# Patient Record
Sex: Male | Born: 1970
Health system: Southern US, Community
[De-identification: ages and names within clinical notes are randomized; demographics above are authoritative.]

## PROBLEM LIST (undated history)

## (undated) DIAGNOSIS — E785 Hyperlipidemia, unspecified: Secondary | ICD-10-CM

## (undated) DIAGNOSIS — E119 Type 2 diabetes mellitus without complications: Secondary | ICD-10-CM

## (undated) HISTORY — DX: Type 2 diabetes mellitus without complications: E11.9

## (undated) HISTORY — DX: Hyperlipidemia, unspecified: E78.5

---

## 1993-08-02 HISTORY — PX: HERNIA REPAIR: SHX51

## 2002-08-02 HISTORY — PX: TONSILLECTOMY: SUR1361

## 2015-03-10 ENCOUNTER — Telehealth: Payer: Self-pay | Admitting: General Practice

## 2015-03-10 NOTE — Telephone Encounter (Signed)
yes

## 2015-03-10 NOTE — Telephone Encounter (Signed)
Pt needing a pcp and Ian Woodard had recommended you to him. Please advise

## 2015-03-10 NOTE — Telephone Encounter (Signed)
LMOVM

## 2015-03-18 ENCOUNTER — Ambulatory Visit (INDEPENDENT_AMBULATORY_CARE_PROVIDER_SITE_OTHER)
Admission: RE | Admit: 2015-03-18 | Discharge: 2015-03-18 | Disposition: A | Discharge status: Home / Self Care | Payer: BLUE CROSS/BLUE SHIELD | Source: Ambulatory Visit | Attending: Internal Medicine | Admitting: Internal Medicine

## 2015-03-18 ENCOUNTER — Encounter: Payer: Self-pay | Admitting: Internal Medicine

## 2015-03-18 ENCOUNTER — Ambulatory Visit (INDEPENDENT_AMBULATORY_CARE_PROVIDER_SITE_OTHER): Payer: BLUE CROSS/BLUE SHIELD | Admitting: Internal Medicine

## 2015-03-18 VITALS — BP 108/80 | HR 80 | Temp 98.2°F | Resp 16 | Ht 71.0 in | Wt 194.0 lb

## 2015-03-18 DIAGNOSIS — R1314 Dysphagia, pharyngoesophageal phase: Secondary | ICD-10-CM | POA: Diagnosis not present

## 2015-03-18 DIAGNOSIS — R05 Cough: Secondary | ICD-10-CM

## 2015-03-18 DIAGNOSIS — N528 Other male erectile dysfunction: Secondary | ICD-10-CM

## 2015-03-18 DIAGNOSIS — J189 Pneumonia, unspecified organism: Secondary | ICD-10-CM

## 2015-03-18 DIAGNOSIS — N529 Male erectile dysfunction, unspecified: Secondary | ICD-10-CM

## 2015-03-18 DIAGNOSIS — R059 Cough, unspecified: Secondary | ICD-10-CM

## 2015-03-18 MED ORDER — AMOXICILLIN-POT CLAVULANATE 875-125 MG PO TABS
1.0000 | ORAL_TABLET | Freq: Two times a day (BID) | ORAL | Status: DC
Start: 2015-03-18 — End: 2015-04-04

## 2015-03-18 MED ORDER — HYDROCODONE-HOMATROPINE 5-1.5 MG/5ML PO SYRP: 5.0000 mL | ORAL_SOLUTION | Freq: Three times a day (TID) | ORAL | Status: DC | PRN

## 2015-03-18 MED ORDER — SILDENAFIL CITRATE 100 MG PO TABS: 100.0000 mg | ORAL_TABLET | Freq: Every day | ORAL | Status: DC | PRN

## 2015-03-18 NOTE — Progress Notes (Signed)
Pre visit review using our clinic review tool, if applicable. No additional management support is needed unless otherwise documented below in the visit note. 

## 2015-03-18 NOTE — Patient Instructions (Signed)
Cough, Adult  A cough is a reflex that helps clear your throat and airways. It can help heal the body or may be a reaction to an irritated airway. A cough may only last 2 or 3 weeks (acute) or may last more than 8 weeks (chronic).  CAUSES Acute cough:  Viral or bacterial infections. Chronic cough:  Infections.  Allergies.  Asthma.  Post-nasal drip.  Smoking.  Heartburn or acid reflux.  Some medicines.  Chronic lung problems (COPD).  Cancer. SYMPTOMS   Cough.  Fever.  Chest pain.  Increased breathing rate.  High-pitched whistling sound when breathing (wheezing).  Colored mucus that you cough up (sputum). TREATMENT   A bacterial cough may be treated with antibiotic medicine.  A viral cough must run its course and will not respond to antibiotics.  Your caregiver may recommend other treatments if you have a chronic cough. HOME CARE INSTRUCTIONS   Only take over-the-counter or prescription medicines for pain, discomfort, or fever as directed by your caregiver. Use cough suppressants only as directed by your caregiver.  Use a cold steam vaporizer or humidifier in your bedroom or home to help loosen secretions.  Sleep in a semi-upright position if your cough is worse at night.  Rest as needed.  Stop smoking if you smoke. SEEK IMMEDIATE MEDICAL CARE IF:   You have pus in your sputum.  Your cough starts to worsen.  You cannot control your cough with suppressants and are losing sleep.  You begin coughing up blood.  You have difficulty breathing.  You develop pain which is getting worse or is uncontrolled with medicine.  You have a fever. MAKE SURE YOU:   Understand these instructions.  Will watch your condition.  Will get help right away if you are not doing well or get worse. Document Released: 01/15/2011 Document Revised: 10/11/2011 Document Reviewed: 01/15/2011 ExitCare Patient Information 2015 [employer]. This information is not intended  to replace advice given to you by your health care provider. Make sure you discuss any questions you have with your health care provider.  

## 2015-03-19 ENCOUNTER — Encounter: Payer: Self-pay | Admitting: Physician Assistant

## 2015-03-19 LAB — HIV ANTIBODY: HIV 1 Ab: NEGATIVE

## 2015-03-19 NOTE — Progress Notes (Signed)
Subjective:  Patient ID: Ian Woodard, male    DOB: [birth year]  Age: 44 y.o. MRN: [id number]  CC: Cough   [name] presents for one-month history of cough productive of thick brown phlegm. He also complains of mild shortness of breath but no wheezing. He also complains of difficulty swallowing and tells me that many years ago he had surgical repair of a hiatal hernia. He thinks he has a history of gastroesophageal reflux disease but he does not complain of heartburn and does not want a medication for heartburn. In addition he complains of a clogged sensation in his right ear.  History Ian Woodard has no past medical history on file.   He has past surgical history that includes Hernia repair (1995) and Tonsillectomy (2004).   His family history includes Hypertension in his father and mother. There is no history of Alcohol abuse, Cancer, COPD, Diabetes, Early death, Drug abuse, Hearing loss, Heart disease, Hyperlipidemia, Kidney disease, or Stroke.He reports that he has never smoked. He has never used smokeless tobacco. He reports that he drinks about 0.6 oz of alcohol per week. He reports that he does not use illicit drugs.  No outpatient prescriptions prior to visit.   No facility-administered medications prior to visit.    ROS Review of Systems  Constitutional: Negative.  Negative for fever, chills, diaphoresis, appetite change and fatigue.  HENT: Positive for hearing loss and trouble swallowing. Negative for congestion, dental problem, ear discharge, ear pain, postnasal drip, rhinorrhea, sinus pressure, sneezing, tinnitus and voice change.   Eyes: Negative.   Respiratory: Positive for cough and shortness of breath. Negative for choking, chest tightness and stridor.   Cardiovascular: Negative.   Gastrointestinal: Negative.  Negative for nausea, vomiting, abdominal pain, diarrhea, constipation and blood in stool.  Endocrine: Negative.   Genitourinary: Negative.  Negative for  urgency, hematuria and difficulty urinating.       He complains of erectile dysfunction for over 2 years. He says he can achieve an erection but then gets anxious and loses his erection. He is requesting a medication to help with that.  Musculoskeletal: Negative.   Skin: Negative.   Allergic/Immunologic: Negative.   Neurological: Negative.   Hematological: Negative.  Negative for adenopathy. Does not bruise/bleed easily.  Psychiatric/Behavioral: Negative.     Objective:  BP 108/80 mmHg  Pulse 109  Temp(Src) 98.2 F (36.8 C) (Oral)  Resp 16  Ht 5\' 11"  (1.803 m)  Wt 194 lb (87.998 kg)  BMI 27.07 kg/m2  SpO2 98%  Physical Exam  Constitutional: He is oriented to person, place, and time. He appears well-developed and well-nourished.  Non-toxic appearance. He does not have a sickly appearance. He does not appear ill. No distress.  HENT:  Right Ear: Hearing, tympanic membrane and external ear normal. A foreign body (cerumen) is present.  Left Ear: Hearing, tympanic membrane, external ear and ear canal normal.  Mouth/Throat: Mucous membranes are normal. Mucous membranes are not pale, not dry and not cyanotic. No oropharyngeal exudate, posterior oropharyngeal edema, posterior oropharyngeal erythema or tonsillar abscesses.  I put Colace in his right ear and then we irrigated it with water. All of the cerumen was removed. He tolerated this procedure well. Examination afterwards is normal.  Eyes: Conjunctivae are normal. Right eye exhibits no discharge. Left eye exhibits no discharge. No scleral icterus.  Neck: Normal range of motion. Neck supple. No JVD present. No tracheal deviation present. No thyromegaly present.  Cardiovascular: Normal rate, regular rhythm, normal heart  sounds and intact distal pulses.  Exam reveals no gallop and no friction rub.   No murmur heard. Pulmonary/Chest: Effort normal and breath sounds normal. No stridor. No respiratory distress. He has no wheezes. He has no  rales. He exhibits no tenderness.  Abdominal: Soft. Bowel sounds are normal. He exhibits no distension and no mass. There is no tenderness. There is no rebound and no guarding.  Musculoskeletal: Normal range of motion. He exhibits no edema or tenderness.  Lymphadenopathy:    He has no cervical adenopathy.  Neurological: He is oriented to person, place, and time.  Skin: Skin is warm and dry. No rash noted. He is not diaphoretic. No erythema. No pallor.      Assessment & Plan:   Ian Woodard was seen today for cough.  Diagnoses and all orders for this visit:  Cough- his chest x-ray is remarkable only for low volumes and atelectasis but there is no mass or infiltrate -     DG Chest 2 View; Future -     HYDROcodone-homatropine (HYCODAN) 5-1.5 MG/5ML syrup; Take 5 mLs by mouth every 8 (eight) hours as needed for cough.  CAP (community acquired pneumonia)- will treat his infection with Augmentin and he will use Hycodan as needed for the cough -     DG Chest 2 View; Future -     amoxicillin-clavulanate (AUGMENTIN) 875-125 MG per tablet; Take 1 tablet by mouth 2 (two) times daily. -     HYDROcodone-homatropine (HYCODAN) 5-1.5 MG/5ML syrup; Take 5 mLs by mouth every 8 (eight) hours as needed for cough.  Dysphagia, pharyngoesophageal phase -     Ambulatory referral to Gastroenterology  Erectile dysfunction of organic origin -     sildenafil (VIAGRA) 100 MG tablet; Take 1 tablet (100 mg total) by mouth daily as needed for erectile dysfunction.   I am having Ian Woodard start on amoxicillin-clavulanate, HYDROcodone-homatropine, and sildenafil.  Meds ordered this encounter  Medications  . amoxicillin-clavulanate (AUGMENTIN) 875-125 MG per tablet    Sig: Take 1 tablet by mouth 2 (two) times daily.    Dispense:  20 tablet    Refill:  0  . HYDROcodone-homatropine (HYCODAN) 5-1.5 MG/5ML syrup    Sig: Take 5 mLs by mouth every 8 (eight) hours as needed for cough.    Dispense:  120 mL    Refill:   0  . sildenafil (VIAGRA) 100 MG tablet    Sig: Take 1 tablet (100 mg total) by mouth daily as needed for erectile dysfunction.    Dispense:  10 tablet    Refill:  11     Follow-up: Return in about 3 weeks (around 04/08/2015).  Sanda Linger, MD

## 2015-04-04 ENCOUNTER — Encounter: Payer: Self-pay | Admitting: Physician Assistant

## 2015-04-04 ENCOUNTER — Ambulatory Visit (INDEPENDENT_AMBULATORY_CARE_PROVIDER_SITE_OTHER): Payer: BLUE CROSS/BLUE SHIELD | Admitting: Physician Assistant

## 2015-04-04 VITALS — BP 122/68 | HR 70 | Ht 71.0 in | Wt 195.0 lb

## 2015-04-04 DIAGNOSIS — K219 Gastro-esophageal reflux disease without esophagitis: Secondary | ICD-10-CM | POA: Diagnosis not present

## 2015-04-04 DIAGNOSIS — IMO0001 Reserved for inherently not codable concepts without codable children: Secondary | ICD-10-CM

## 2015-04-04 DIAGNOSIS — R1310 Dysphagia, unspecified: Secondary | ICD-10-CM

## 2015-04-04 DIAGNOSIS — R143 Flatulence: Secondary | ICD-10-CM

## 2015-04-04 MED ORDER — PANTOPRAZOLE SODIUM 40 MG PO TBEC: DELAYED_RELEASE_TABLET | ORAL | Status: DC

## 2015-04-04 NOTE — Patient Instructions (Addendum)
Take Beano with meals. We sent a prescription to [facility] [address] for Pantoprazole sodium 40 mg. You have been scheduled for an endoscopy. Please follow written instructions given to you at your visit today. If you use inhalers (even only as needed), please bring them with you on the day of your procedure.  You have been scheduled for a Barium Esophogram at [facility], go to the [employer]. on  04-14-2015 at 10:00 am . Please arrive at 9:45 am to your appointment for registration. Make certain not to have anything to eat or drink 3 hours prior to your test. If you need to reschedule for any reason, please contact radiology at [phone] to do so. __________________________________________________________________ A barium swallow is an examination that concentrates on views of the esophagus. This tends to be a double contrast exam (barium and two liquids which, when combined, create a gas to distend the wall of the oesophagus) or single contrast (non-ionic iodine based). The study is usually tailored to your symptoms so a good history is essential. Attention is paid during the study to the form, structure and configuration of the esophagus, looking for functional disorders (such as aspiration, dysphagia, achalasia, motility and reflux) EXAMINATION You may be asked to change into a gown, depending on the type of swallow being performed. A radiologist and radiographer will perform the procedure. The radiologist will advise you of the type of contrast selected for your procedure and direct you during the exam. You will be asked to stand, sit or lie in several different positions and to hold a small amount of fluid in your mouth before being asked to swallow while the imaging is performed .In some instances you may be asked to swallow barium coated marshmallows to assess the motility of a solid food bolus. The exam can be recorded as a digital or video fluoroscopy procedure. POST  PROCEDURE It will take 1-2 days for the barium to pass through your system. To facilitate this, it is important, unless otherwise directed, to increase your fluids for the next 24-48hrs and to resume your normal diet.  This test typically takes about 30 minutes to perform. __________________________________________________________________________________

## 2015-04-04 NOTE — Progress Notes (Addendum)
Patient ID: Ian Woodard, male   DOB: 1971/07/28, 44 y.o.   MRN: [id number]    HPI:  Ian Woodard is a 44 y.o.   male referred by [name], MD for evaluation of dysphagia.  Rory states that he was evaluated several times in the 1990s by Dr. Corinda Gubler. At that time, he was experiencing dysphagia to solids and liquids. He states he had 3 upper endoscopies with dilations, and each time had relief of his dysphagia for a period of time. He subsequently had surgery for a hiatal hernia and what he describes as a "pocket at the top of my esophagus". (?Zenkers?) He has done well since that time but he states that in July 8 developed a nonproductive cough. He was evaluated by his primary care provider and sent for a chest x-ray which he states was negative. He was treated with Hycodan syrup which he says has provided little relief. He was also given a 7 day course of Augmentin for  Possible community-acquired pneumonia. He feels this did not help his symptoms. He is here today with complaints of a several month history of intermittent heartburn. He is again experiencing difficulty swallowing solids. 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 Over the past 1-2 months he has been experiencing intermittent dysphagia to liquids as well. He notes that if he drinks something really cold he will feel like he has a lump in his chest. Further questioning reveals that he was scheduled for a 24-hour pH test in the 1990s but was unable to tolerate placement of the pH probe due to his gag reflex.  His bowel movements are for the most part normal but for the past couple of weeks he has skipped 2 days and then has 2 bowel movements in the morning. He has no bright red blood per rectum or melena. His appetite has been good. He is purposely trying to lose weight. He complains of gas when he eats raw vegetables. He denies a family history of colon cancer, colon polyps, or inflammatory bowel disease. He does  state that his maternal grandfather had colon issues, but he thinks this was irritable bowel.   Past Medical History  Diagnosis Date  . Diabetes mellitus     Past Surgical History  Procedure Laterality Date  . Hernia repair  1995  . Tonsillectomy  2004   Family History  Problem Relation Age of Onset  . Hypertension Mother   . Hypertension Father   . Alcohol abuse Neg Hx   . Cancer Neg Hx   . COPD Neg Hx   . Diabetes Neg Hx   . Early death Neg Hx   . Drug abuse Neg Hx   . Hearing loss Neg Hx   . Heart disease Neg Hx   . Hyperlipidemia Neg Hx   . Kidney disease Neg Hx   . Stroke Neg Hx    Social History  Substance Use Topics  . Smoking status: Never Smoker   . Smokeless tobacco: Never Used  . Alcohol Use: 0.6 oz/week    0 Standard drinks or equivalent, 1 Glasses of wine per week   Current Outpatient Prescriptions  Medication Sig Dispense Refill  . HYDROcodone-homatropine (HYCODAN) 5-1.5 MG/5ML syrup Take 5 mLs by mouth every 8 (eight) hours as needed for cough. 120 mL 0  . pantoprazole (PROTONIX) 40 MG tablet Take 1 tab 30 min prior to breakfast. 30 tablet 6   No current facility-administered medications for  this visit.   No Known Allergies   Review of Systems: Gen: Denies any fever, chills, sweats, anorexia, fatigue, weakness, malaise, weight loss, and sleep disorder CV: Denies chest pain, angina, palpitations, syncope, orthopnea, PND, peripheral edema, and claudication. Resp: Denies dyspnea at rest, dyspnea with exercise,  sputum, wheezing, coughing up blood, and pleurisy. Has a nonproductive cough. GI: Denies vomiting blood, jaundice, and fecal incontinence.  Has dysphagia to solids and liquids. GU : Denies urinary burning, blood in urine, urinary frequency, urinary hesitancy, nocturnal urination, and urinary incontinence. MS: Denies joint pain, limitation of movement, and swelling, stiffness, low back pain, extremity pain. Denies muscle weakness, cramps, atrophy.   Derm: Denies rash, itching, dry skin, hives, moles, warts, or unhealing ulcers.  Psych: Denies depression, anxiety, memory loss, suicidal ideation, hallucinations, paranoia, and confusion. Heme: Denies bruising, bleeding, and enlarged lymph nodes. Neuro:  Denies any headaches, dizziness, paresthesias. Endo:  Denies any problems with DM, thyroid, adrenal function  Studies: Dg Chest 2 View  03/18/2015   CLINICAL DATA:  Cough.  EXAM: CHEST  2 VIEW  COMPARISON:  None.  FINDINGS: Mediastinum and hilar structures normal. Low lung volumes with mild bibasilar atelectasis. No focal infiltrate. No pleural effusion or pneumothorax. No acute bony abnormality.  IMPRESSION: Low lung volumes with mild bibasilar subsegmental atelectasis.   Electronically Signed   By: Maisie Fus  Register   On: 03/18/2015 17:04     Physical Exam: BP 122/68 mmHg  Pulse 70  Ht 5\' 11"  (1.803 m)  Wt 195 lb (88.451 kg)  BMI 27.21 kg/m2 Constitutional: Pleasant,well-developed, male in no acute distress. HEENT: Normocephalic and atraumatic. Conjunctivae are normal. No scleral icterus. Neck supple. No JVD. No thyromegaly Cardiovascular: Normal rate, regular rhythm.  Pulmonary/chest: Effort normal and breath sounds normal. No wheezing, rales or rhonchi. Abdominal: Soft, nondistended, nontender. Bowel sounds active throughout. There are no masses palpable. No hepatomegaly. Extremities: no edema Lymphadenopathy: No cervical adenopathy noted. Neurological: Alert and oriented to person place and time. Skin: Skin is warm and dry. No rashes noted. Psychiatric: Normal mood and affect. Behavior is normal.  ASSESSMENT AND PLAN: 74 -year-old male with a long-standing history of GERD, status post EGD with dilation 3 in the 1990s followed by subsequent hiatal hernia repair, now with recurrent dysphagia, intermittent heartburn, and a nonproductive cough. We have contacted medical records to obtain copies of his prior endoscopy reports as  well as his operative report. Unfortunately these are on hold paper charts and will have to be retrieved from the warehouse. He file will be mailed to our office when it is retrieved. In the meantime, an antireflux regimen has been reviewed. He will be given a trial of pantoprazole 40 mg 1 by mouth every morning 30 minutes prior to breakfast. He will be scheduled for a barium swallow with tablet to help ascertain if he perhaps has a recurrent stricture, hiatal hernia etc. He will then be scheduled for an EGD to evaluate for esophagitis, recurrent stricture, etc.The risks, benefits, and alternatives to endoscopy with possible biopsy and possible dilation were discussed with the patient and they consent to proceed. In the meantime regards to his flatulence with ingestion of that staple's, he will try Beano with meals and snacks. He will try to add fiber to his diet as well to help regulate his stools. Further recommendations will be made pending the findings of his esophagram and EGD.    Briani Maul, Tollie Pizza PA-C 04/04/2015, 9:22 PM  CC: [name], MD  04/18/2015: Addendum:  Received old paper records from storage. Patient had an upper endoscopy on Dec 18, 1993 that revealed a 3 cm sliding hiatal hernia with stricture formation and mild reflux esophagitis. The stricture caused 75-80% obstruction. The stomach revealed a mild diffuse gastritis. Pyloric channel had slight inflammation. Duodenal bulb had mild duodenitis. Patient was started on Prilosec. (A second paper chart was reviewed that showed the patient underwent a laparoscopic Nissen fundoplication in early December 1995 by Dr. Chelsea Aus of [facility]).  EGD 06/24/1991 was performed due to dysphagia. Esophagus was noted to have a 3-4 cm sliding hiatal hernia with mildly acute and chronic reflux esophagitis with early stricture formation. Stomach had mild patchy gastritis. Pyloric channel had slight inflammation. Duodenal bulb  had mild to moderate duodenitis with absolute was ulcers and biopsies were obtained in the post bulbar region. Pathology revealed sections of duodenal mucosa with active chronic duodenitis. No granulomas are seen. Focal ulceration and surface fibrinopurulent debris are present.  EGD 10/27/1989 was performed due to vomiting and reflux symptoms. Patient was noted to have a 2-3 cm sliding hiatal hernia with a somewhat spastic esophagus which was dilated with 44 and 50 mm dilators. Stomach had mild patchy gastritis. Pyloric channel slightly inflamed. Duodenal bulb mild duodenitis.

## 2015-04-08 NOTE — Progress Notes (Signed)
Agree with assessment and plan as outlined. Will review prior records in the interim when obtained.

## 2015-04-10 ENCOUNTER — Ambulatory Visit (HOSPITAL_COMMUNITY): Payer: BLUE CROSS/BLUE SHIELD

## 2015-04-14 ENCOUNTER — Ambulatory Visit (HOSPITAL_COMMUNITY)
Admission: RE | Admit: 2015-04-14 | Discharge: 2015-04-14 | Disposition: A | Discharge status: Home / Self Care | Payer: BLUE CROSS/BLUE SHIELD | Source: Ambulatory Visit | Attending: Physician Assistant | Admitting: Physician Assistant

## 2015-04-14 DIAGNOSIS — R143 Flatulence: Secondary | ICD-10-CM | POA: Diagnosis not present

## 2015-04-14 DIAGNOSIS — K219 Gastro-esophageal reflux disease without esophagitis: Secondary | ICD-10-CM | POA: Diagnosis not present

## 2015-04-14 DIAGNOSIS — R1310 Dysphagia, unspecified: Secondary | ICD-10-CM | POA: Diagnosis not present

## 2015-04-14 DIAGNOSIS — IMO0001 Reserved for inherently not codable concepts without codable children: Secondary | ICD-10-CM

## 2015-04-14 MED ORDER — MAGNESIUM HYDROXIDE 400 MG/5ML PO SUSP
ORAL | Status: AC
  Filled 2015-04-14: qty 30

## 2015-05-16 ENCOUNTER — Encounter: Payer: Self-pay | Admitting: Gastroenterology

## 2015-05-16 ENCOUNTER — Ambulatory Visit (AMBULATORY_SURGERY_CENTER): Payer: BLUE CROSS/BLUE SHIELD | Admitting: Gastroenterology

## 2015-05-16 VITALS — BP 101/70 | HR 62 | Temp 96.6°F | Resp 24 | Ht 71.0 in | Wt 195.0 lb

## 2015-05-16 DIAGNOSIS — R1310 Dysphagia, unspecified: Secondary | ICD-10-CM

## 2015-05-16 MED ORDER — SODIUM CHLORIDE 0.9 % IV SOLN: 500.0000 mL | INTRAVENOUS | Status: DC

## 2015-05-16 NOTE — Progress Notes (Signed)
Transferred to recovery room. A/O x3, pleased with MAC.  VSS.  Report to Celia, RN. 

## 2015-05-16 NOTE — Op Note (Signed)
[facility] [address] [place], [id number]   ENDOSCOPY PROCEDURE REPORT  PATIENT: Ian Woodard, Ian Woodard  MR#: [id number] BIRTHDATE: [birth year] , 44  yrs. old GENDER: male ENDOSCOPIST: Benancio DeedsSteven P Ziaire Bieser, MD REFERRED BY: PROCEDURE DATE:  05/16/2015 PROCEDURE:  EGD w/ biopsy ASA CLASS:     Class II INDICATIONS:  dysphagia. MEDICATIONS: Propofol 200 mg IV TOPICAL ANESTHETIC:  DESCRIPTION OF PROCEDURE: After the risks benefits and alternatives of the procedure were thoroughly explained, informed consent was obtained.  The LB [id number] endoscope was introduced through the mouth and advanced to the second portion of the duodenum , Without limitations.  The instrument was slowly withdrawn as the mucosa was fully examined.  FINDINGS:The examined esophagus was normal.  No stenosis or stricture was appreciated.  Biopsies were taken to rule out eosinophilic esophagitis.  The DH, GEJ, and SCJ were located 40cm from the incisors.  There was evidence of a Nissen fundoplication noted on retroflexion in the stomach, which was intact without laxity.  The stomach was remarkable for diffuse erythematous gastropathy without focal ulceration or erosion.  Biopsies were taken to rule out H pylori.  The dudoenal bulb and 2nd portion of the duodenum were unremarkable.  Retroflexed views revealed as previously described.     The scope was then withdrawn from the patient and the procedure completed. COMPLICATIONS: There were no immediate complications.  ENDOSCOPIC IMPRESSION: Normal esophagus without stenosis or stricture - biopsies obtained to rule out EoE Evidence of prior Nissen fundoplication, intact without laxity Erythematous gastropathy - biopsied to rule out H pylori Normal duodenum  No overt evidence of pathology noted on this exam to cause dysphagia other than known Nissen. Await biopsy results and consider esophageal manometry if symptoms  persist.  RECOMMENDATIONS: Resume diet Resume medications Avoid NSAIDs given gastritis noted on this exam Continue protonix Await pathology results    eSigned:  Benancio DeedsSteven P Craig Wisnewski, MD 05/16/2015 10:21 AM    CC: the patient  PATIENT NAME:  Ian Woodard, Ian Woodard MR#: [id number]

## 2015-05-16 NOTE — Progress Notes (Signed)
Called to room to assist during endoscopic procedure.  Patient ID and intended procedure confirmed with present staff. Received instructions for my participation in the procedure from the performing physician.  

## 2015-05-16 NOTE — Patient Instructions (Signed)
Discharge instructions given. Biopsies taken. Avoid all NSAIDS. Resume previous medications. YOU HAD AN ENDOSCOPIC PROCEDURE TODAY AT THE [facility]:   Refer to the procedure report that was given to you for any specific questions about what was found during the examination.  If the procedure report does not answer your questions, please call your gastroenterologist to clarify.  If you requested that your care partner not be given the details of your procedure findings, then the procedure report has been included in a sealed envelope for you to review at your convenience later.  YOU SHOULD EXPECT: Some feelings of bloating in the abdomen. Passage of more gas than usual.  Walking can help get rid of the air that was put into your GI tract during the procedure and reduce the bloating. If you had a lower endoscopy (such as a colonoscopy or flexible sigmoidoscopy) you may notice spotting of blood in your stool or on the toilet paper. If you underwent a bowel prep for your procedure, you may not have a normal bowel movement for a few days.  Please Note:  You might notice some irritation and congestion in your nose or some drainage.  This is from the oxygen used during your procedure.  There is no need for concern and it should clear up in a day or so.  SYMPTOMS TO REPORT IMMEDIATELY:   Following upper endoscopy (EGD)  Vomiting of blood or coffee ground material  New chest pain or pain under the shoulder blades  Painful or persistently difficult swallowing  New shortness of breath  Fever of 100F or higher  Black, tarry-looking stools  For urgent or emergent issues, a gastroenterologist can be reached at any hour by calling [phone].   DIET: Your first meal following the procedure should be a small meal and then it is ok to progress to your normal diet. Heavy or fried foods are harder to digest and may make you feel nauseous or bloated.  Likewise, meals heavy in dairy and  vegetables can increase bloating.  Drink plenty of fluids but you should avoid alcoholic beverages for 24 hours.  ACTIVITY:  You should plan to take it easy for the rest of today and you should NOT DRIVE or use heavy machinery until tomorrow (because of the sedation medicines used during the test).    FOLLOW UP: Our staff will call the number listed on your records the next business day following your procedure to check on you and address any questions or concerns that you may have regarding the information given to you following your procedure. If we do not reach you, we will leave a message.  However, if you are feeling well and you are not experiencing any problems, there is no need to return our call.  We will assume that you have returned to your regular daily activities without incident.  If any biopsies were taken you will be contacted by phone or by letter within the next 1-3 weeks.  Please call us at [phone] if you have not heard about the biopsies in 3 weeks.    SIGNATURES/CONFIDENTIALITY: You and/or your care partner have signed paperwork which will be entered into your electronic medical record.  These signatures attest to the fact that that the information above on your After Visit Summary has been reviewed and is understood.  Full responsibility of the confidentiality of this discharge information lies with you and/or your care-partner. 

## 2015-05-19 ENCOUNTER — Telehealth: Payer: Self-pay | Admitting: *Deleted

## 2015-05-19 NOTE — Telephone Encounter (Signed)
No answer, message left for the patient. 

## 2015-05-22 ENCOUNTER — Other Ambulatory Visit: Payer: Self-pay

## 2015-05-22 DIAGNOSIS — IMO0001 Reserved for inherently not codable concepts without codable children: Secondary | ICD-10-CM

## 2015-05-22 DIAGNOSIS — A048 Other specified bacterial intestinal infections: Secondary | ICD-10-CM

## 2015-05-22 DIAGNOSIS — R1310 Dysphagia, unspecified: Secondary | ICD-10-CM

## 2015-05-22 DIAGNOSIS — K219 Gastro-esophageal reflux disease without esophagitis: Secondary | ICD-10-CM

## 2015-05-22 MED ORDER — METRONIDAZOLE 500 MG PO TABS: 500.0000 mg | ORAL_TABLET | Freq: Two times a day (BID) | ORAL | Status: DC

## 2015-05-22 MED ORDER — CLARITHROMYCIN 500 MG PO TABS: 500.0000 mg | ORAL_TABLET | Freq: Two times a day (BID) | ORAL | Status: DC

## 2015-05-22 MED ORDER — PANTOPRAZOLE SODIUM 40 MG PO TBEC: 40.0000 mg | DELAYED_RELEASE_TABLET | Freq: Two times a day (BID) | ORAL | Status: DC

## 2015-05-22 MED ORDER — AMOXICILLIN 500 MG PO CAPS: 1000.0000 mg | ORAL_CAPSULE | Freq: Two times a day (BID) | ORAL | Status: DC

## 2015-07-03 ENCOUNTER — Telehealth: Payer: Self-pay | Admitting: *Deleted

## 2015-07-03 NOTE — Telephone Encounter (Signed)
-----   Message from Annett FabianSheri L Jones, RN sent at 05/22/2015  2:51 PM EDT -----   ----- Message -----    From: Annett FabianSheri L Jones, RN    Sent: 05/22/2015   2:50 PM      To: Daphine Deutscheregina N Tyronn Golda, RN  Patient needs stool antigen.  He will need to hold PPI for 2 weeks.  Please call and remind him to stop protonix.  Orders are placed

## 2015-07-03 NOTE — Telephone Encounter (Signed)
Left a message for patient to call back. 

## 2015-07-08 NOTE — Telephone Encounter (Signed)
Left a message for patient to call back. 

## 2015-07-09 NOTE — Telephone Encounter (Signed)
Left a message for patient to call back. 

## 2015-07-09 NOTE — Telephone Encounter (Signed)
Patient

## 2015-07-09 NOTE — Telephone Encounter (Signed)
Spoke with patient and he will stop Protonix for 2 weeks then do h. Pylori stool study.

## 2015-08-13 ENCOUNTER — Ambulatory Visit: Payer: BLUE CROSS/BLUE SHIELD | Admitting: Family

## 2015-08-15 ENCOUNTER — Encounter: Payer: Self-pay | Admitting: Internal Medicine

## 2015-08-15 ENCOUNTER — Ambulatory Visit (INDEPENDENT_AMBULATORY_CARE_PROVIDER_SITE_OTHER): Payer: BLUE CROSS/BLUE SHIELD | Admitting: Internal Medicine

## 2015-08-15 VITALS — BP 126/74 | HR 88 | Temp 98.1°F | Ht 71.0 in | Wt 193.0 lb

## 2015-08-15 DIAGNOSIS — L02212 Cutaneous abscess of back [any part, except buttock]: Secondary | ICD-10-CM | POA: Diagnosis not present

## 2015-08-15 MED ORDER — SULFAMETHOXAZOLE-TRIMETHOPRIM 800-160 MG PO TABS
1.0000 | ORAL_TABLET | Freq: Two times a day (BID) | ORAL | Status: DC
Start: 2015-08-15 — End: 2018-09-27

## 2015-08-15 NOTE — Progress Notes (Signed)
Pre visit review using our clinic review tool, if applicable. No additional management support is needed unless otherwise documented below in the visit note. 

## 2015-08-15 NOTE — Patient Instructions (Signed)
Please take all new medication as prescribed - the antibiotic  Please continue all other medications as before, and refills have been done if requested.  Please have the pharmacy call with any other refills you may need.  Please keep your appointments with your specialists as you may have planned  Please call if you change your mind about the surgury referral

## 2015-08-15 NOTE — Progress Notes (Signed)
   Subjective:    Patient ID: Ian Woodard, male    DOB: 09/08/1970, 45 y.o.   MRN: [id number]  HPI  Here with c/o 1 wk gradually worsening right lower back red/swelling/tender with low grade temp, assoc with drainage some the 3 days ago, but persists and maybe slightly increased again in size, no greater than egg size.  Pt denies chest pain, increased sob or doe, wheezing, orthopnea, PND, increased LE swelling, palpitations, dizziness or syncope.  Pt denies new neurological symptoms such as new headache, or facial or extremity weakness or numbness. No high fever, chills Past Medical History  Diagnosis Date  . Diabetes mellitus [facility](HCC)    Past Surgical History  Procedure Laterality Date  . Hernia repair  1995  . Tonsillectomy  2004    reports that he has never smoked. He has never used smokeless tobacco. He reports that he drinks about 0.6 oz of alcohol per week. He reports that he does not use illicit drugs. family history includes Aneurysm in his father; Breast cancer in his maternal grandmother; Hypertension in his father and mother. There is no history of Alcohol abuse, Cancer, COPD, Diabetes, Early death, Drug abuse, Hearing loss, Heart disease, Hyperlipidemia, Kidney disease, or Stroke. No Known Allergies Current Outpatient Prescriptions on File Prior to Visit  Medication Sig Dispense Refill  . amoxicillin (AMOXIL) 500 MG capsule Take 2 capsules (1,000 mg total) by mouth 2 (two) times daily. 56 capsule 0  . clarithromycin (BIAXIN) 500 MG tablet Take 1 tablet (500 mg total) by mouth 2 (two) times daily. 28 tablet 0  . HYDROcodone-homatropine (HYCODAN) 5-1.5 MG/5ML syrup Take 5 mLs by mouth every 8 (eight) hours as needed for cough. 120 mL 0  . metroNIDAZOLE (FLAGYL) 500 MG tablet Take 1 tablet (500 mg total) by mouth 2 (two) times daily. 28 tablet 0  . pantoprazole (PROTONIX) 40 MG tablet Take 1 tablet (40 mg total) by mouth 2 (two) times daily. 30 tablet 6  . VIAGRA 100 MG tablet   10     No current facility-administered medications on file prior to visit.   Review of Systems  All otherwise neg per pt    Objective:   Physical Exam BP 126/74 mmHg  Pulse 88  Temp(Src) 98.1 F (36.7 C) (Oral)  Ht 5\' 11"  (1.803 m)  Wt 193 lb (87.544 kg)  BMI 26.93 kg/m2  SpO2 97% VS noted, non toxic Constitutional: Pt appears in no significant distress HENT: Head: NCAT.  Right Ear: External ear normal.  Left Ear: External ear normal.  Eyes: . Pupils are equal, round, and reactive to light. Conjunctivae and EOM are normal Neck: Normal range of motion. Neck supple.  Cardiovascular: Normal rate and regular rhythm.   Pulmonary/Chest: Effort normal and breath sounds without rales or wheezing.  Abd:  Soft, NT, ND, + BS Neurological: Pt is alert. Not confused , motor grossly intact Skin: Skin is warm. No rash, no LE edema, with right lower back lumbar paravertebral sarea with 2-3 cm size fluctuance without ulcer or drainage, no red streaks Psychiatric: Pt behavior is normal. No agitation.     Assessment & Plan:

## 2015-08-16 NOTE — Assessment & Plan Note (Signed)
Moderate in size, d/w pt, likely needs surgical attention and drainage with wound care, but declines, for antibx asd,  to f/u any worsening symptoms or concerns

## 2018-02-15 ENCOUNTER — Other Ambulatory Visit: Payer: Self-pay

## 2018-02-15 ENCOUNTER — Emergency Department (HOSPITAL_COMMUNITY): Payer: Self-pay

## 2018-02-15 ENCOUNTER — Emergency Department (HOSPITAL_COMMUNITY)
Admission: EM | Admit: 2018-02-15 | Discharge: 2018-02-15 | Disposition: A | Discharge status: Home / Self Care | Payer: Self-pay | Attending: Emergency Medicine | Admitting: Emergency Medicine

## 2018-02-15 ENCOUNTER — Encounter (HOSPITAL_COMMUNITY): Payer: Self-pay | Admitting: Emergency Medicine

## 2018-02-15 DIAGNOSIS — E119 Type 2 diabetes mellitus without complications: Secondary | ICD-10-CM | POA: Insufficient documentation

## 2018-02-15 DIAGNOSIS — K5900 Constipation, unspecified: Secondary | ICD-10-CM

## 2018-02-15 DIAGNOSIS — R339 Retention of urine, unspecified: Secondary | ICD-10-CM | POA: Insufficient documentation

## 2018-02-15 DIAGNOSIS — K5641 Fecal impaction: Secondary | ICD-10-CM | POA: Insufficient documentation

## 2018-02-15 MED ORDER — PEG 3350-KCL-NA BICARB-NACL 420 G PO SOLR
ORAL | 0 refills | Status: DC
Start: 2018-02-15 — End: 2018-10-03

## 2018-02-15 MED ORDER — MILK AND MOLASSES ENEMA
1.0000 | Freq: Once | RECTAL | Status: AC
  Administered 2018-02-15: 250 mL via RECTAL
  Filled 2018-02-15: qty 250

## 2018-02-15 MED ORDER — DOCUSATE SODIUM 100 MG PO CAPS: 100.0000 mg | ORAL_CAPSULE | Freq: Two times a day (BID) | ORAL | 0 refills | Status: DC

## 2018-02-15 MED ORDER — FLEET ENEMA 7-19 GM/118ML RE ENEM: 1.0000 | ENEMA | Freq: Every day | RECTAL | 0 refills | Status: DC | PRN

## 2018-02-15 NOTE — ED Triage Notes (Signed)
Pt states he is constipated and has not had BM since last Monday.... But pt states there is stool on his pants from where he "pooped himself" Offered pt paper scrubs to change in to but he declined.

## 2018-02-15 NOTE — ED Notes (Signed)
Pt reprots " I feel a lot better at this time "

## 2018-02-15 NOTE — Discharge Instructions (Addendum)
Seen today for constipation and fecal impaction.  Take GoLYTELY at home until finished.  After cleanout, initiate Colace for stool softening.  Make sure to avoid constipating medications or foods.  Increase fiber in your diet.  You were noted also have acute urinary retention.  This is likely related to the constipation.  This should improve given that you are now stooling.

## 2018-02-15 NOTE — ED Notes (Signed)
Pt requesting medication to relax him for his in and out cath. He had a bad experience with a previous cath.

## 2018-02-15 NOTE — ED Provider Notes (Signed)
[facility] EMERGENCY DEPARTMENT Provider Note   CSN: [id number] Arrival date & time: 02/15/18  0119     History   Chief Complaint Chief Complaint  Patient presents with  . Constipation    [name] is a 47 y.o. male.  HPI  This is a 47 year old male with a remote history of diabetes who presents with constipation.  Patient reports that he has not had a bowel movement in over 1 week.  He denies any nausea, vomiting.  Denies any recent changes in medications are constipating medications.  Patient states that he has lots of pressure and pain in his rectum.  He denies any significant abdominal pain.  However, he does feel that his bladder is full and he is having difficulty emptying his bladder.  Patient did take mag citrate with no improvement.  He states that now he is "leaking from back there."  He denies any back pain or leg weakness.  Patient denies any significant history of constipation in the past.  Past Medical History:  Diagnosis Date  . Diabetes mellitus [facility])     Patient Active Problem List   Diagnosis Date Noted  . Abscess of lower back 08/15/2015  . Cough 03/18/2015  . CAP (community acquired pneumonia) 03/18/2015  . Dysphagia, pharyngoesophageal phase 03/18/2015  . 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 HERNIA REPAIR  1995  . TONSILLECTOMY  2004        Home Medications    Prior to Admission medications   Medication Sig Start Date End Date Taking? Authorizing Provider  docusate sodium (COLACE) 100 MG capsule Take 100 mg by mouth 2 (two) times daily as needed for mild constipation.   Yes [provider]  ibuprofen (ADVIL,MOTRIN) 200 MG tablet Take 200 mg by mouth every 6 (six) hours as needed for fever, headache or mild pain.   Yes [provider]  amoxicillin (AMOXIL) 500 MG capsule Take 2 capsules (1,000 mg total) by mouth 2 (two) times  daily. Patient not taking: Reported on 02/15/2018 05/22/15   Benancio Deeds, MD  clarithromycin (BIAXIN) 500 MG tablet Take 1 tablet (500 mg total) by mouth 2 (two) times daily. Patient not taking: Reported on 02/15/2018 05/22/15   Benancio Deeds, MD  docusate sodium (COLACE) 100 MG capsule Take 1 capsule (100 mg total) by mouth every 12 (twelve) hours. 02/15/18   Cana Mignano, Mayer Masker, MD  HYDROcodone-homatropine (HYCODAN) 5-1.5 MG/5ML syrup Take 5 mLs by mouth every 8 (eight) hours as needed for cough. Patient not taking: Reported on 02/15/2018 03/18/15   [name], MD  metroNIDAZOLE (FLAGYL) 500 MG tablet Take 1 tablet (500 mg total) by mouth 2 (two) times daily. Patient not taking: Reported on 02/15/2018 05/22/15   Benancio Deeds, MD  pantoprazole (PROTONIX) 40 MG tablet Take 1 tablet (40 mg total) by mouth 2 (two) times daily. Patient not taking: Reported on 02/15/2018 05/22/15   Benancio Deeds, MD  polyethylene glycol-electrolytes (NULYTELY/GOLYTELY) 420 g solution Take 8 oz every 15-30 minutes until finished 02/15/18   Dealva Lafoy, Mayer Masker, MD  sodium phosphate (FLEET) 7-19 GM/118ML ENEM Place 133 mLs (1 enema total) rectally daily as needed for severe constipation. 02/15/18   Zaiyah Sottile, Mayer Masker, MD  sulfamethoxazole-trimethoprim (BACTRIM DS) 800-160 MG tablet Take 1 tablet by mouth 2 (two) times daily. Patient not taking: Reported on 02/15/2018 08/15/15   Corwin Levins, MD  Family History Family History  Problem Relation Age of Onset  . Hypertension Mother   . Hypertension Father   . Aneurysm Father   . Breast cancer Maternal Grandmother   . Alcohol abuse Neg Hx   . Cancer Neg Hx   . COPD Neg Hx   . Diabetes Neg Hx   . Early death Neg Hx   . Drug abuse Neg Hx   . Hearing loss Neg Hx   . Heart disease Neg Hx   . Hyperlipidemia Neg Hx   . Kidney disease Neg Hx   . Stroke Neg Hx     Social History Social History   Tobacco Use  . Smoking status: Never  Smoker  . Smokeless tobacco: Never Used  Substance Use Topics  . Alcohol use: Yes    Alcohol/week: 0.6 oz    Types: 1 Glasses of wine per week  . Drug use: No     Allergies   Patient has no known allergies.   Review of Systems Review of Systems  Constitutional: Negative for fever.  Respiratory: Negative for shortness of breath.   Cardiovascular: Negative for chest pain.  Gastrointestinal: Positive for constipation. Negative for abdominal pain, diarrhea, nausea and vomiting.  Genitourinary: Positive for difficulty urinating.  All other systems reviewed and are negative.    Physical Exam Updated Vital Signs BP 123/89 (BP Location: Right Arm)   Pulse 99   Temp 98.4 F (36.9 C) (Oral)   Resp 18   Ht 5\' 11"  (1.803 m)   Wt 79.4 kg (175 lb)   SpO2 100%   BMI 24.41 kg/m   Physical Exam  Constitutional: He is oriented to person, place, and time.  Uncomfortable appearing but nontoxic  HENT:  Head: Normocephalic and atraumatic.  Cardiovascular: Normal rate, regular rhythm and normal heart sounds.  Pulmonary/Chest: Effort normal and breath sounds normal. No respiratory distress. He has no wheezes.  Abdominal: Soft. Bowel sounds are normal. He exhibits mass. There is no tenderness. There is no rebound.  Lower abdomen with palpable urinary bladder  Genitourinary:  Genitourinary Comments: Rectum appears dilated with fecal matter at the anus, hard fecal impaction noted on digital rectal exam, patient manually disimpacted  Musculoskeletal: He exhibits no edema.  Lymphadenopathy:    He has no cervical adenopathy.  Neurological: He is alert and oriented to person, place, and time.  Skin: Skin is warm and dry.  Psychiatric: He has a normal mood and affect.  Nursing note and vitals reviewed.    ED Treatments / Results  Labs (all labs ordered are listed, but only abnormal results are displayed) Labs Reviewed - No data to display  EKG None  Radiology Dg Abdomen 1  View  Result Date: 02/15/2018 CLINICAL DATA:  47 year old male with constipation abdominal pain. EXAM: ABDOMEN - 1 VIEW COMPARISON:  None. FINDINGS: There is moderate stool throughout the colon with a large fecal content in the rectal vault which may represent fecal impaction. No dilatation of the small bowel or evidence of obstruction no free air. The urinary bladder is distended. No acute osseous pathology. IMPRESSION: Constipation with possible fecal impaction in the rectum. No bowel obstruction. Electronically Signed   By: Elgie CollardArash  Radparvar M.D.   On: 02/15/2018 06:27    Procedures Procedures (including critical care time)  Medications Ordered in ED Medications  milk and molasses enema (has no administration in time range)     Initial Impression / Assessment and Plan / ED Course  I have reviewed the  triage vital signs and the nursing notes.  Pertinent labs & imaging results that were available during my care of the patient were reviewed by me and considered in my medical decision making (see chart for details).  Clinical Course as of Feb 15 746  Wed Feb 15, 2018  0715 Bladder scan over 999cc.  Quested in and out cath.   [CH]    Clinical Course User Index [CH] Neomia Herbel, Mayer Masker, MD    Patient presents with constipation.  On exam he appears clinically impacted.  Denies symptoms that would suggest bowel obstruction.  He has no significant abdominal pain but does report some urinary retention.  This is likely related to the constipation.  Patient was manually disimpacted at the bedside.  This was followed by milk of molasses enema that resulted in good bowel movement.  Abdominal x-ray shows evidence of impaction as well as a distended urinary bladder.  Bladder scan as above.  In and out cath was performed.  Suspect that acute retention was related to constipation.  I would anticipate this would get better as patient cleaned out.  Unclear etiology of constipation.  Recommend GoLYTELY  cleanout followed by daily stool softeners.  Patient is agreeable to plan.  After history, exam, and medical workup I feel the patient has been appropriately medically screened and is safe for discharge home. Pertinent diagnoses were discussed with the patient. Patient was given return precautions.   Final Clinical Impressions(s) / ED Diagnoses   Final diagnoses:  Fecal impaction in rectum (HCC)  Constipation, unspecified constipation type  Urinary retention    ED Discharge Orders        Ordered    polyethylene glycol-electrolytes (NULYTELY/GOLYTELY) 420 g solution     02/15/18 0734    docusate sodium (COLACE) 100 MG capsule  Every 12 hours     02/15/18 0734    sodium phosphate (FLEET) 7-19 GM/118ML ENEM  Daily PRN     02/15/18 0734       Shon Baton, MD 02/15/18 [phone]

## 2018-02-15 NOTE — ED Notes (Signed)
1 Liter from bladder skin reported by EMT.

## 2018-02-15 NOTE — ED Notes (Addendum)
Pt has had 5-6 Moderate amount BM since he got the enema

## 2018-09-27 ENCOUNTER — Inpatient Hospital Stay (HOSPITAL_COMMUNITY)
Admission: EM | Admit: 2018-09-27 | Discharge: 2018-10-03 | DRG: 982 | Disposition: A | Discharge status: Home / Self Care | Payer: Self-pay | Source: Other Acute Inpatient Hospital | Attending: Internal Medicine | Admitting: Internal Medicine

## 2018-09-27 ENCOUNTER — Other Ambulatory Visit: Payer: Self-pay

## 2018-09-27 ENCOUNTER — Encounter (HOSPITAL_COMMUNITY): Payer: Self-pay | Admitting: Emergency Medicine

## 2018-09-27 ENCOUNTER — Ambulatory Visit (INDEPENDENT_AMBULATORY_CARE_PROVIDER_SITE_OTHER): Payer: Self-pay

## 2018-09-27 ENCOUNTER — Ambulatory Visit
Admission: EM | Admit: 2018-09-27 | Discharge: 2018-09-27 | Disposition: A | Discharge status: Home / Self Care | Payer: Self-pay | Attending: Emergency Medicine | Admitting: Emergency Medicine

## 2018-09-27 DIAGNOSIS — B951 Streptococcus, group B, as the cause of diseases classified elsewhere: Secondary | ICD-10-CM | POA: Diagnosis present

## 2018-09-27 DIAGNOSIS — L089 Local infection of the skin and subcutaneous tissue, unspecified: Secondary | ICD-10-CM

## 2018-09-27 DIAGNOSIS — E871 Hypo-osmolality and hyponatremia: Secondary | ICD-10-CM | POA: Diagnosis present

## 2018-09-27 DIAGNOSIS — L03115 Cellulitis of right lower limb: Secondary | ICD-10-CM

## 2018-09-27 DIAGNOSIS — M1A9XX1 Chronic gout, unspecified, with tophus (tophi): Secondary | ICD-10-CM | POA: Diagnosis present

## 2018-09-27 DIAGNOSIS — E1142 Type 2 diabetes mellitus with diabetic polyneuropathy: Secondary | ICD-10-CM

## 2018-09-27 DIAGNOSIS — L02619 Cutaneous abscess of unspecified foot: Secondary | ICD-10-CM

## 2018-09-27 DIAGNOSIS — E876 Hypokalemia: Secondary | ICD-10-CM | POA: Diagnosis present

## 2018-09-27 DIAGNOSIS — E11621 Type 2 diabetes mellitus with foot ulcer: Secondary | ICD-10-CM | POA: Diagnosis present

## 2018-09-27 DIAGNOSIS — Z79899 Other long term (current) drug therapy: Secondary | ICD-10-CM

## 2018-09-27 DIAGNOSIS — Z888 Allergy status to other drugs, medicaments and biological substances status: Secondary | ICD-10-CM

## 2018-09-27 DIAGNOSIS — L02611 Cutaneous abscess of right foot: Secondary | ICD-10-CM | POA: Diagnosis present

## 2018-09-27 DIAGNOSIS — E11628 Type 2 diabetes mellitus with other skin complications: Secondary | ICD-10-CM

## 2018-09-27 DIAGNOSIS — E1110 Type 2 diabetes mellitus with ketoacidosis without coma: Secondary | ICD-10-CM | POA: Diagnosis present

## 2018-09-27 DIAGNOSIS — I96 Gangrene, not elsewhere classified: Secondary | ICD-10-CM | POA: Diagnosis present

## 2018-09-27 DIAGNOSIS — R739 Hyperglycemia, unspecified: Secondary | ICD-10-CM

## 2018-09-27 DIAGNOSIS — Z803 Family history of malignant neoplasm of breast: Secondary | ICD-10-CM

## 2018-09-27 DIAGNOSIS — L03119 Cellulitis of unspecified part of limb: Secondary | ICD-10-CM | POA: Diagnosis present

## 2018-09-27 DIAGNOSIS — E1152 Type 2 diabetes mellitus with diabetic peripheral angiopathy with gangrene: Principal | ICD-10-CM | POA: Diagnosis present

## 2018-09-27 DIAGNOSIS — E860 Dehydration: Secondary | ICD-10-CM | POA: Diagnosis present

## 2018-09-27 DIAGNOSIS — R5081 Fever presenting with conditions classified elsewhere: Secondary | ICD-10-CM

## 2018-09-27 DIAGNOSIS — Z9114 Patient's other noncompliance with medication regimen: Secondary | ICD-10-CM

## 2018-09-27 DIAGNOSIS — L97519 Non-pressure chronic ulcer of other part of right foot with unspecified severity: Secondary | ICD-10-CM | POA: Diagnosis present

## 2018-09-27 DIAGNOSIS — R000 Tachycardia, unspecified: Secondary | ICD-10-CM

## 2018-09-27 DIAGNOSIS — E0810 Diabetes mellitus due to underlying condition with ketoacidosis without coma: Secondary | ICD-10-CM

## 2018-09-27 DIAGNOSIS — Z8249 Family history of ischemic heart disease and other diseases of the circulatory system: Secondary | ICD-10-CM

## 2018-09-27 LAB — CBG MONITORING, ED
Glucose-Capillary: 295 mg/dL — ABNORMAL HIGH (ref 70–99)
Glucose-Capillary: 249 mg/dL — ABNORMAL HIGH (ref 70–99)
Glucose-Capillary: 231 mg/dL — ABNORMAL HIGH (ref 70–99)
Glucose-Capillary: 227 mg/dL — ABNORMAL HIGH (ref 70–99)

## 2018-09-27 LAB — CBC WITH DIFFERENTIAL/PLATELET
Abs Immature Granulocytes: 0.09 10*3/uL — ABNORMAL HIGH (ref 0.00–0.07)
BASOS PCT: 0 %
Basophils Absolute: 0 10*3/uL (ref 0.0–0.1)
Eosinophils Absolute: 0.1 10*3/uL (ref 0.0–0.5)
Eosinophils Relative: 1 %
HCT: 43.3 % (ref 39.0–52.0)
Hemoglobin: 14.8 g/dL (ref 13.0–17.0)
Immature Granulocytes: 1 %
Lymphocytes Relative: 7 %
Lymphs Abs: 1 10*3/uL (ref 0.7–4.0)
MCH: 28.8 pg (ref 26.0–34.0)
MCHC: 34.2 g/dL (ref 30.0–36.0)
MCV: 84.2 fL (ref 80.0–100.0)
Monocytes Absolute: 1.4 10*3/uL — ABNORMAL HIGH (ref 0.1–1.0)
Monocytes Relative: 11 %
Neutro Abs: 11 10*3/uL — ABNORMAL HIGH (ref 1.7–7.7)
Neutrophils Relative %: 80 %
Platelets: 330 10*3/uL (ref 150–400)
RBC: 5.14 MIL/uL (ref 4.22–5.81)
RDW: 12.2 % (ref 11.5–15.5)
WBC: 13.6 10*3/uL — ABNORMAL HIGH (ref 4.0–10.5)
nRBC: 0 % (ref 0.0–0.2)

## 2018-09-27 LAB — POCT FASTING CBG KUC MANUAL ENTRY: POCT GLUCOSE (MANUAL ENTRY) KUC: 304 mg/dL — AB (ref 70–99)

## 2018-09-27 LAB — COMPREHENSIVE METABOLIC PANEL
ALBUMIN: 3.5 g/dL (ref 3.5–5.0)
ALK PHOS: 95 U/L (ref 38–126)
ALT: 9 U/L (ref 0–44)
AST: 13 U/L — ABNORMAL LOW (ref 15–41)
Anion gap: 19 — ABNORMAL HIGH (ref 5–15)
BUN: 12 mg/dL (ref 6–20)
CALCIUM: 8.7 mg/dL — AB (ref 8.9–10.3)
CO2: 14 mmol/L — ABNORMAL LOW (ref 22–32)
Chloride: 95 mmol/L — ABNORMAL LOW (ref 98–111)
Creatinine, Ser: 1.08 mg/dL (ref 0.61–1.24)
GFR calc Af Amer: >60 mL/min (ref >60)
GFR calc non Af Amer: >60 mL/min (ref >60)
Glucose, Bld: 311 mg/dL — ABNORMAL HIGH (ref 70–99)
Potassium: 4 mmol/L (ref 3.5–5.1)
Sodium: 128 mmol/L — ABNORMAL LOW (ref 135–145)
TOTAL PROTEIN: 7 g/dL (ref 6.5–8.1)
Total Bilirubin: 1.3 mg/dL — ABNORMAL HIGH (ref 0.3–1.2)

## 2018-09-27 LAB — GLUCOSE, CAPILLARY
Glucose-Capillary: 289 mg/dL — ABNORMAL HIGH (ref 70–99)
Glucose-Capillary: 259 mg/dL — ABNORMAL HIGH (ref 70–99)
Glucose-Capillary: 216 mg/dL — ABNORMAL HIGH (ref 70–99)
Glucose-Capillary: 151 mg/dL — ABNORMAL HIGH (ref 70–99)

## 2018-09-27 LAB — LACTIC ACID, PLASMA
Lactic Acid, Venous: 1.3 mmol/L (ref 0.5–1.9)
Lactic Acid, Venous: 1.7 mmol/L (ref 0.5–1.9)

## 2018-09-27 MED ORDER — VANCOMYCIN HCL 10 G IV SOLR
1500.0000 mg | Freq: Once | INTRAVENOUS | Status: AC
  Administered 2018-09-27: 1500 mg via INTRAVENOUS
  Filled 2018-09-27 (×2): qty 1500

## 2018-09-27 MED ORDER — FLEET ENEMA 7-19 GM/118ML RE ENEM: 1.0000 | ENEMA | Freq: Every day | RECTAL | Status: DC | PRN

## 2018-09-27 MED ORDER — DOCUSATE SODIUM 100 MG PO CAPS
100.0000 mg | ORAL_CAPSULE | Freq: Two times a day (BID) | ORAL | Status: DC
  Administered 2018-09-27: 100 mg via ORAL
  Filled 2018-09-27: qty 1

## 2018-09-27 MED ORDER — ACETAMINOPHEN 325 MG PO TABS
975.0000 mg | ORAL_TABLET | Freq: Once | ORAL | Status: AC
  Administered 2018-09-27: 975 mg via ORAL

## 2018-09-27 MED ORDER — ZOLPIDEM TARTRATE 5 MG PO TABS
5.0000 mg | ORAL_TABLET | Freq: Every evening | ORAL | Status: DC | PRN
  Administered 2018-09-27 – 2018-10-02 (×8): 5 mg via ORAL
  Filled 2018-09-27 (×8): qty 1

## 2018-09-27 MED ORDER — DEXTROSE-NACL 5-0.45 % IV SOLN
INTRAVENOUS | Status: DC
  Administered 2018-09-27 – 2018-09-28 (×2): via INTRAVENOUS

## 2018-09-27 MED ORDER — CEFTRIAXONE SODIUM 1 G IJ SOLR
1.0000 g | Freq: Once | INTRAMUSCULAR | Status: AC
  Administered 2018-09-27: 1 g via INTRAMUSCULAR

## 2018-09-27 MED ORDER — ENOXAPARIN SODIUM 40 MG/0.4ML ~~LOC~~ SOLN
40.0000 mg | SUBCUTANEOUS | Status: DC
  Administered 2018-09-27 – 2018-09-29 (×3): 40 mg via SUBCUTANEOUS
  Filled 2018-09-27 (×3): qty 0.4

## 2018-09-27 MED ORDER — VANCOMYCIN HCL IN DEXTROSE 1-5 GM/200ML-% IV SOLN: 1000.0000 mg | Freq: Once | INTRAVENOUS | Status: DC

## 2018-09-27 MED ORDER — SODIUM CHLORIDE 0.9 % IV BOLUS
500.0000 mL | Freq: Once | INTRAVENOUS | Status: AC
  Administered 2018-09-28: 500 mL via INTRAVENOUS

## 2018-09-27 MED ORDER — VANCOMYCIN HCL IN DEXTROSE 1-5 GM/200ML-% IV SOLN
1000.0000 mg | Freq: Two times a day (BID) | INTRAVENOUS | Status: DC
  Administered 2018-09-28: 1000 mg via INTRAVENOUS
  Filled 2018-09-27 (×2): qty 200

## 2018-09-27 MED ORDER — INSULIN REGULAR(HUMAN) IN NACL 100-0.9 UT/100ML-% IV SOLN
INTRAVENOUS | Status: DC
  Administered 2018-09-27: 1.9 [IU]/h via INTRAVENOUS
  Filled 2018-09-27: qty 100

## 2018-09-27 MED ORDER — SODIUM CHLORIDE 0.9 % IV BOLUS
1000.0000 mL | Freq: Once | INTRAVENOUS | Status: AC
  Administered 2018-09-27: 1000 mL via INTRAVENOUS

## 2018-09-27 MED ORDER — PANTOPRAZOLE SODIUM 40 MG PO TBEC: 40.0000 mg | DELAYED_RELEASE_TABLET | Freq: Two times a day (BID) | ORAL | Status: DC

## 2018-09-27 MED ORDER — PIPERACILLIN-TAZOBACTAM 3.375 G IVPB 30 MIN
3.3750 g | Freq: Once | INTRAVENOUS | Status: AC
  Administered 2018-09-27: 3.375 g via INTRAVENOUS
  Filled 2018-09-27: qty 50

## 2018-09-27 MED ORDER — LIDOCAINE HCL (PF) 1 % IJ SOLN
2.0000 mL | Freq: Once | INTRAMUSCULAR | Status: AC
  Administered 2018-09-27: 2 mL

## 2018-09-27 MED ORDER — SODIUM CHLORIDE 0.9% FLUSH: 3.0000 mL | Freq: Once | INTRAVENOUS | Status: DC

## 2018-09-27 MED ORDER — PIPERACILLIN-TAZOBACTAM 3.375 G IVPB
3.3750 g | Freq: Three times a day (TID) | INTRAVENOUS | Status: DC
  Administered 2018-09-28: 3.375 g via INTRAVENOUS
  Filled 2018-09-27: qty 50

## 2018-09-27 MED ORDER — POTASSIUM CHLORIDE CRYS ER 20 MEQ PO TBCR
40.0000 meq | EXTENDED_RELEASE_TABLET | Freq: Once | ORAL | Status: AC
  Administered 2018-09-27: 40 meq via ORAL
  Filled 2018-09-27: qty 2

## 2018-09-27 MED ORDER — SODIUM CHLORIDE 0.9 % IV SOLN
2.0000 g | INTRAVENOUS | Status: DC
  Administered 2018-09-27: 2 g via INTRAVENOUS
  Filled 2018-09-27: qty 20

## 2018-09-27 MED ORDER — ACETAMINOPHEN 325 MG PO TABS
650.0000 mg | ORAL_TABLET | Freq: Four times a day (QID) | ORAL | Status: DC | PRN
  Administered 2018-09-27 – 2018-09-29 (×3): 650 mg via ORAL
  Filled 2018-09-27 (×3): qty 2

## 2018-09-27 MED ORDER — HYDROCODONE-HOMATROPINE 5-1.5 MG/5ML PO SYRP: 5.0000 mL | ORAL_SOLUTION | Freq: Three times a day (TID) | ORAL | Status: DC | PRN

## 2018-09-27 MED ORDER — SODIUM CHLORIDE 0.9 % IV SOLN
INTRAVENOUS | Status: DC
  Administered 2018-09-27: 18:00:00 via INTRAVENOUS

## 2018-09-27 NOTE — Discharge Instructions (Signed)
I am worried about the severity of likely infection to your food, causing fever and high heart rate, in the presence of elevated blood sugar.  I feel you need further evaluation and treatment in the ER at this time.

## 2018-09-27 NOTE — ED Triage Notes (Signed)
Pt reports going to [facility] for feeling horrible and they told him he needs to come to the hospital for an infected foot, the right foot. Pt reports he accidentally ripped off the nail on the right great toe. Same is read, swollen, and warm to the touch.

## 2018-09-27 NOTE — ED Notes (Signed)
Khen-Dinner Tray Ordered @ 1758-per Sierra, RN-called by [facility] [employer]

## 2018-09-27 NOTE — ED Notes (Signed)
RN to send 1 visitor back post Pt arrival to Rm

## 2018-09-27 NOTE — Progress Notes (Addendum)
Pharmacy Antibiotic Note  Ian Woodard is a 48 y.o. male admitted on 09/27/2018 with cellulitis.  Pharmacy has been consulted for vancomycin dosing. Also with ceftriaxone ordered per EDP. WBC 13.6, LA 1.3. SCr pending.  Plan: Ceftriaxone 2g IV q24h per EDP Vancomycin 1500mg  IV x 1 Monitor clinical progress, c/s, renal function F/u de-escalation plan/LOT, vancomycin levels as indicated F/u SCr for vancomycin maintenance doses  ADDENDUM: SCr 1.08 on admit.  Plan: Vancomycin 1000 mg IV Q 12 hrs. Goal AUC 400-550. Expected AUC: 474 SCr used: 1.08   Height: 5\' 11"  (180.3 cm) Weight: 165 lb (74.8 kg) IBW/kg (Calculated) : 75.3  Temp (24hrs), Avg:100.1 F (37.8 C), Min:98.2 F (36.8 C), Max:101.9 F (38.8 C)  No results for input(s): WBC, CREATININE, LATICACIDVEN, VANCOTROUGH, VANCOPEAK, VANCORANDOM, GENTTROUGH, GENTPEAK, GENTRANDOM, TOBRATROUGH, TOBRAPEAK, TOBRARND, AMIKACINPEAK, AMIKACINTROU, AMIKACIN in the last 168 hours.  CrCl cannot be calculated (No successful lab value found.).    No Known Allergies  Ian Woodard, PharmD, BCPS Please check AMION for all [facility] contact numbers Clinical Pharmacist 09/27/2018 1:01 PM    [address]: Change Rocephin to Zosyn at 3.375g IV q 8hrs. Ian Woodard, PharmD, BCPS Clinical Staff Pharmacist

## 2018-09-27 NOTE — ED Provider Notes (Signed)
[facility]    CSN: [id number] Arrival date & time: 09/27/18  1054     History   Chief Complaint Chief Complaint  Patient presents with  . Fever    [name] is a 48 y.o. male.   Ian Woodard presents with complaints of body aches, fevers, redness and swelling to great toe of right foot. States he noticed the body aches 2/23, the following day noted his foot redness and swelling. States two weeks ago he caught his foot on something which caused his toe nail to rip off with significant bleeding. Denies any pain to the area. Has been taking aspirin which helps some, last at 0830 this morning. Denies runny nose, sore throat, cough or congestion. Denies polyuria or polydipsia. He has been soaking his toe which has not helped. Denies any previous similar. States he has a DM history but completed a study and since has not had to take any medications for treatment. States has very low appetite and has lost approximately 100 lbs in the past 2 years without trying. He does not have a PCP.  Doesn't take any medications regularly. No other gi/gu complaints. No headache. No neck pain.    ROS per HPI.      Past Medical History:  Diagnosis Date  . Diabetes mellitus [facility])     Patient Active Problem List   Diagnosis Date Noted  . Abscess of lower back 08/15/2015  . Cough 03/18/2015  . CAP (community acquired pneumonia) 03/18/2015  . Dysphagia, pharyngoesophageal phase 03/18/2015  . 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 HERNIA REPAIR  1995  . TONSILLECTOMY  2004       Home Medications    Prior to Admission medications   Medication Sig Start Date End Date Taking? Authorizing Provider  amoxicillin (AMOXIL) 500 MG capsule Take 2 capsules (1,000 mg total) by mouth 2 (two) times daily. Patient not taking: Reported on 02/15/2018 05/22/15   Benancio Deeds, MD  clarithromycin (BIAXIN) 500 MG  tablet Take 1 tablet (500 mg total) by mouth 2 (two) times daily. Patient not taking: Reported on 02/15/2018 05/22/15   Benancio Deeds, MD  docusate sodium (COLACE) 100 MG capsule Take 100 mg by mouth 2 (two) times daily as needed for mild constipation.    [provider]  docusate sodium (COLACE) 100 MG capsule Take 1 capsule (100 mg total) by mouth every 12 (twelve) hours. 02/15/18   Horton, Mayer Masker, MD  HYDROcodone-homatropine (HYCODAN) 5-1.5 MG/5ML syrup Take 5 mLs by mouth every 8 (eight) hours as needed for cough. Patient not taking: Reported on 02/15/2018 03/18/15   [name], MD  ibuprofen (ADVIL,MOTRIN) 200 MG tablet Take 200 mg by mouth every 6 (six) hours as needed for fever, headache or mild pain.    [provider]  metroNIDAZOLE (FLAGYL) 500 MG tablet Take 1 tablet (500 mg total) by mouth 2 (two) times daily. Patient not taking: Reported on 02/15/2018 05/22/15   Benancio Deeds, MD  pantoprazole (PROTONIX) 40 MG tablet Take 1 tablet (40 mg total) by mouth 2 (two) times daily. Patient not taking: Reported on 02/15/2018 05/22/15   Benancio Deeds, MD  polyethylene glycol-electrolytes (NULYTELY/GOLYTELY) 420 g solution Take 8 oz every 15-30 minutes until finished 02/15/18   Horton, Mayer Masker, MD  sodium phosphate (FLEET) 7-19 GM/118ML ENEM Place 133 mLs (1 enema total) rectally daily as needed for severe constipation.  02/15/18   Horton, Mayer Maskerourtney F, MD  sulfamethoxazole-trimethoprim (BACTRIM DS) 800-160 MG tablet Take 1 tablet by mouth 2 (two) times daily. Patient not taking: Reported on 02/15/2018 08/15/15   Corwin LevinsJohn, James W, MD    Family History Family History  Problem Relation Age of Onset  . Hypertension Mother   . Hypertension Father   . Aneurysm Father   . Breast cancer Maternal Grandmother   . Alcohol abuse Neg Hx   . Cancer Neg Hx   . COPD Neg Hx   . Diabetes Neg Hx   . Early death Neg Hx   . Drug abuse Neg Hx   . Hearing loss Neg Hx   .  Heart disease Neg Hx   . Hyperlipidemia Neg Hx   . Kidney disease Neg Hx   . Stroke Neg Hx     Social History Social History   Tobacco Use  . Smoking status: Never Smoker  . Smokeless tobacco: Never Used  Substance Use Topics  . Alcohol use: Yes    Alcohol/week: 1.0 standard drinks    Types: 1 Glasses of wine per week  . Drug use: No     Allergies   Patient has no known allergies.   Review of Systems Review of Systems   Physical Exam Triage Vital Signs ED Triage Vitals [09/27/18 1110]  Enc Vitals Group     BP 113/73     Pulse Rate (!) 124     Resp 18     Temp (!) 101.9 F (38.8 C)     Temp Source Oral     SpO2 96 %     Weight      Height      Head Circumference      Peak Flow      Pain Score 0     Pain Loc      Pain Edu?      Excl. in GC?    No data found.  Updated Vital Signs BP 113/73 (BP Location: Left Arm)   Pulse (!) 124   Temp (!) 101.9 F (38.8 C) (Oral)   Resp 18   SpO2 96%    Physical Exam Constitutional:      Appearance: He is well-developed.  Cardiovascular:     Rate and Rhythm: Regular rhythm. Tachycardia present.  Pulmonary:     Effort: Pulmonary effort is normal.     Breath sounds: Normal breath sounds.  Musculoskeletal:     Right foot: Normal range of motion and normal capillary refill. Swelling present. No tenderness, bony tenderness, crepitus, deformity or laceration.     Comments: Right great toe with large swelling and redness; warm to touch; no drainage; callused skin to plantar aspect with open area; full ROM noted; cap refill < 2 seconds ; damaged nail noted; no active drainage; see photos provided; strong pedal pulse; redness does not extend up foot or ankle  Skin:    General: Skin is warm and dry.  Neurological:     Mental Status: He is alert and oriented to person, place, and time.          UC Treatments / Results  Labs (all labs ordered are listed, but only abnormal results are displayed) Labs Reviewed    POCT FASTING CBG [facility] MANUAL ENTRY    EKG None  Radiology Dg Foot Complete Right  Result Date: 09/27/2018 CLINICAL DATA:  Great toenail ripped off 1-2 weeks ago, redness and swelling question osteomyelitis EXAM: RIGHT FOOT COMPLETE -  3+ VIEW COMPARISON:  None. FINDINGS: Soft tissue swelling great toe. Osseous mineralization normal. Joint space narrowing at first MTP joint with spur formation. Remaining joint spaces preserved. No acute fracture, dislocation, or bone destruction. Specifically no radiographic evidence of osteomyelitis at the great toe. IMPRESSION: No acute osseous abnormalities. Degenerative changes first MTP joint. Electronically Signed   By: Ulyses Southward M.D.   On: 09/27/2018 12:03    Procedures Procedures (including critical care time)  Medications Ordered in UC Medications  acetaminophen (TYLENOL) tablet 975 mg (975 mg Oral Given 09/27/18 1200)  cefTRIAXone (ROCEPHIN) injection 1 g (1 g Intramuscular Given 09/27/18 1200)    Initial Impression / Assessment and Plan / UC Course  I have reviewed the triage vital signs and the nursing notes.  Pertinent labs & imaging results that were available during my care of the patient were reviewed by me and considered in my medical decision making (see chart for details).     Blood sugar 304 in clinic today, with fever, tachycardia, redness and swelling without sensation to right great toe. He is not on any diabetes treatment. Concern about sepsis in presence of untreated diabetes. He does not have a PCP, stating he doesn't have insurance. Recommend further evaluation and treatment in ED at this time. Patient verbalized understanding and agreeable to plan.   Ambulatory out of clinic without difficulty.  Safe for self transport, his mother is taking him.  Final Clinical Impressions(s) / UC Diagnoses   Final diagnoses:  Cellulitis of right lower extremity  Elevated blood sugar     Discharge Instructions     I am worried about  the severity of likely infection to your food, causing fever and high heart rate, in the presence of elevated blood sugar.  I feel you need further evaluation and treatment in the ER at this time.     ED Prescriptions    None     Controlled Substance Prescriptions [place] Controlled Substance Registry consulted? Not Applicable   Georgetta Haber, NP 09/27/18 1216

## 2018-09-27 NOTE — ED Notes (Signed)
Pt CBG was 231, notified Sierra(RN)

## 2018-09-27 NOTE — ED Provider Notes (Signed)
[facility] EMERGENCY DEPARTMENT Provider Note   CSN: [id number] Arrival date & time: 09/27/18  1233    History   Chief Complaint Chief Complaint  Patient presents with  . Wound Infection    right great toe    [name] is a 49 y.o. male.     Patient with hx DM, presents c/o fevers to 101 and progressive swelling/redness to right great toe and foot. States started a week ago when accidentally had part of right toe nail ripped off. No current tx for his DM. +fever/chills. No vomiting. Pain to foot is mild, acute onset, slowly worse, constant, dull, non radiating. Denies hx neuropathy. Patient went to urgent care and was referred to ED.  The history is provided by the patient.    Past Medical History:  Diagnosis Date  . Diabetes mellitus [facility])     Patient Active Problem List   Diagnosis Date Noted  . Abscess of lower back 08/15/2015  . Cough 03/18/2015  . CAP (community acquired pneumonia) 03/18/2015  . Dysphagia, pharyngoesophageal phase 03/18/2015  . 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 HERNIA REPAIR  1995  . TONSILLECTOMY  2004        Home Medications    Prior to Admission medications   Medication Sig Start Date End Date Taking? Authorizing Provider  amoxicillin (AMOXIL) 500 MG capsule Take 2 capsules (1,000 mg total) by mouth 2 (two) times daily. Patient not taking: Reported on 02/15/2018 05/22/15   Benancio Deeds, MD  clarithromycin (BIAXIN) 500 MG tablet Take 1 tablet (500 mg total) by mouth 2 (two) times daily. Patient not taking: Reported on 02/15/2018 05/22/15   Benancio Deeds, MD  docusate sodium (COLACE) 100 MG capsule Take 100 mg by mouth 2 (two) times daily as needed for mild constipation.    [provider]  docusate sodium (COLACE) 100 MG capsule Take 1 capsule (100 mg total) by mouth every 12 (twelve) hours. 02/15/18   Horton,  Mayer Masker, MD  HYDROcodone-homatropine (HYCODAN) 5-1.5 MG/5ML syrup Take 5 mLs by mouth every 8 (eight) hours as needed for cough. Patient not taking: Reported on 02/15/2018 03/18/15   [name], MD  ibuprofen (ADVIL,MOTRIN) 200 MG tablet Take 200 mg by mouth every 6 (six) hours as needed for fever, headache or mild pain.    [provider]  metroNIDAZOLE (FLAGYL) 500 MG tablet Take 1 tablet (500 mg total) by mouth 2 (two) times daily. Patient not taking: Reported on 02/15/2018 05/22/15   Benancio Deeds, MD  pantoprazole (PROTONIX) 40 MG tablet Take 1 tablet (40 mg total) by mouth 2 (two) times daily. Patient not taking: Reported on 02/15/2018 05/22/15   Benancio Deeds, MD  polyethylene glycol-electrolytes (NULYTELY/GOLYTELY) 420 g solution Take 8 oz every 15-30 minutes until finished 02/15/18   Horton, Mayer Masker, MD  sodium phosphate (FLEET) 7-19 GM/118ML ENEM Place 133 mLs (1 enema total) rectally daily as needed for severe constipation. 02/15/18   Horton, Mayer Masker, MD  sulfamethoxazole-trimethoprim (BACTRIM DS) 800-160 MG tablet Take 1 tablet by mouth 2 (two) times daily. Patient not taking: Reported on 02/15/2018 08/15/15   Corwin Levins, MD    Family History Family History  Problem Relation Age of Onset  . Hypertension Mother   . Hypertension Father   . Aneurysm Father   . Breast cancer Maternal Grandmother   . Alcohol abuse Neg Hx   .  Cancer Neg Hx   . COPD Neg Hx   . Diabetes Neg Hx   . Early death Neg Hx   . Drug abuse Neg Hx   . Hearing loss Neg Hx   . Heart disease Neg Hx   . Hyperlipidemia Neg Hx   . Kidney disease Neg Hx   . Stroke Neg Hx     Social History Social History   Tobacco Use  . Smoking status: Never Smoker  . Smokeless tobacco: Never Used  Substance Use Topics  . Alcohol use: Yes    Alcohol/week: 1.0 standard drinks    Types: 1 Glasses of wine per week  . Drug use: No     Allergies   Patient has no known  allergies.   Review of Systems Review of Systems  Constitutional: Positive for fever.  HENT: Negative for sore throat.   Eyes: Negative for redness.  Respiratory: Negative for shortness of breath.   Cardiovascular: Negative for chest pain.  Gastrointestinal: Negative for abdominal pain.  Genitourinary: Negative for flank pain.  Musculoskeletal: Negative for back pain and neck pain.  Skin: Negative for rash.  Neurological: Negative for headaches.  Hematological: Does not bruise/bleed easily.  Psychiatric/Behavioral: Negative for confusion.     Physical Exam Updated Vital Signs BP 115/82 (BP Location: Left Arm)   Pulse (!) 121   Temp 98.2 F (36.8 C) (Oral) Comment: Was given Tylenol at Urgent Care  Resp 17   Ht 1.803 m (5\' 11" )   Wt 74.8 kg   SpO2 95%   BMI 23.01 kg/m   Physical Exam Vitals signs and nursing note reviewed.  Constitutional:      Appearance: Normal appearance. He is well-developed.  HENT:     Head: Atraumatic.     Nose: Nose normal.     Mouth/Throat:     Mouth: Mucous membranes are moist.     Pharynx: Oropharynx is clear.  Eyes:     General: No scleral icterus.    Conjunctiva/sclera: Conjunctivae normal.     Pupils: Pupils are equal, round, and reactive to light.  Neck:     Musculoskeletal: Normal range of motion and neck supple. No neck rigidity.     Trachea: No tracheal deviation.  Cardiovascular:     Rate and Rhythm: Normal rate and regular rhythm.     Pulses: Normal pulses.     Heart sounds: Normal heart sounds. No murmur. No friction rub. No gallop.   Pulmonary:     Effort: Pulmonary effort is normal. No accessory muscle usage or respiratory distress.     Breath sounds: Normal breath sounds.  Abdominal:     General: Bowel sounds are normal. There is no distension.     Palpations: Abdomen is soft.     Tenderness: There is no abdominal tenderness. There is no guarding.  Genitourinary:    Comments: No cva tenderness. Musculoskeletal:         General: No swelling.     Comments: Diffuse swelling, erythema, to right great toe and foot, extending up to ankle. Distal pulses palp. No area of fluctuance. No crepitus.   Skin:    General: Skin is warm and dry.     Findings: No rash.  Neurological:     Mental Status: He is alert.     Comments: Alert, speech clear.   Psychiatric:        Mood and Affect: Mood normal.      ED Treatments / Results  Labs (all labs  ordered are listed, but only abnormal results are displayed) Results for orders placed or performed during the hospital encounter of 09/27/18  Lactic acid, plasma  Result Value Ref Range   Lactic Acid, Venous 1.3 0.5 - 1.9 mmol/L  Comprehensive metabolic panel  Result Value Ref Range   Sodium 128 (L) 135 - 145 mmol/L   Potassium 4.0 3.5 - 5.1 mmol/L   Chloride 95 (L) 98 - 111 mmol/L   CO2 14 (L) 22 - 32 mmol/L   Glucose, Bld 311 (H) 70 - 99 mg/dL   BUN 12 6 - 20 mg/dL   Creatinine, Ser 8.11 0.61 - 1.24 mg/dL   Calcium 8.7 (L) 8.9 - 10.3 mg/dL   Total Protein 7.0 6.5 - 8.1 g/dL   Albumin 3.5 3.5 - 5.0 g/dL   AST 13 (L) 15 - 41 U/L   ALT 9 0 - 44 U/L   Alkaline Phosphatase 95 38 - 126 U/L   Total Bilirubin 1.3 (H) 0.3 - 1.2 mg/dL   GFR calc non Af Amer >60 >60 mL/min   GFR calc Af Amer >60 >60 mL/min   Anion gap 19 (H) 5 - 15  CBC with Differential  Result Value Ref Range   WBC 13.6 (H) 4.0 - 10.5 K/uL   RBC 5.14 4.22 - 5.81 MIL/uL   Hemoglobin 14.8 13.0 - 17.0 g/dL   HCT 91.4 78.2 - 95.6 %   MCV 84.2 80.0 - 100.0 fL   MCH 28.8 26.0 - 34.0 pg   MCHC 34.2 30.0 - 36.0 g/dL   RDW 21.3 08.6 - 57.8 %   Platelets 330 150 - 400 K/uL   nRBC 0.0 0.0 - 0.2 %   Neutrophils Relative % 80 %   Neutro Abs 11.0 (H) 1.7 - 7.7 K/uL   Lymphocytes Relative 7 %   Lymphs Abs 1.0 0.7 - 4.0 K/uL   Monocytes Relative 11 %   Monocytes Absolute 1.4 (H) 0.1 - 1.0 K/uL   Eosinophils Relative 1 %   Eosinophils Absolute 0.1 0.0 - 0.5 K/uL   Basophils Relative 0 %   Basophils  Absolute 0.0 0.0 - 0.1 K/uL   Immature Granulocytes 1 %   Abs Immature Granulocytes 0.09 (H) 0.00 - 0.07 K/uL  CBG monitoring, ED  Result Value Ref Range   Glucose-Capillary 295 (H) 70 - 99 mg/dL   Comment 1 Notify RN    Comment 2 Document in Chart    Dg Foot Complete Right  Result Date: 09/27/2018 CLINICAL DATA:  Great toenail ripped off 1-2 weeks ago, redness and swelling question osteomyelitis EXAM: RIGHT FOOT COMPLETE - 3+ VIEW COMPARISON:  None. FINDINGS: Soft tissue swelling great toe. Osseous mineralization normal. Joint space narrowing at first MTP joint with spur formation. Remaining joint spaces preserved. No acute fracture, dislocation, or bone destruction. Specifically no radiographic evidence of osteomyelitis at the great toe. IMPRESSION: No acute osseous abnormalities. Degenerative changes first MTP joint. Electronically Signed   By: Ulyses Southward M.D.   On: 09/27/2018 12:03    EKG None  Radiology Dg Foot Complete Right  Result Date: 09/27/2018 CLINICAL DATA:  Great toenail ripped off 1-2 weeks ago, redness and swelling question osteomyelitis EXAM: RIGHT FOOT COMPLETE - 3+ VIEW COMPARISON:  None. FINDINGS: Soft tissue swelling great toe. Osseous mineralization normal. Joint space narrowing at first MTP joint with spur formation. Remaining joint spaces preserved. No acute fracture, dislocation, or bone destruction. Specifically no radiographic evidence of osteomyelitis at the great toe. IMPRESSION:  No acute osseous abnormalities. Degenerative changes first MTP joint. Electronically Signed   By: Ulyses Southward M.D.   On: 09/27/2018 12:03    Procedures Procedures (including critical care time)  Medications Ordered in ED Medications  sodium chloride flush (NS) 0.9 % injection 3 mL (has no administration in time range)  vancomycin (VANCOCIN) IVPB 1000 mg/200 mL premix (has no administration in time range)  cefTRIAXone (ROCEPHIN) 2 g in sodium chloride 0.9 % 100 mL IVPB (has no  administration in time range)     Initial Impression / Assessment and Plan / ED Course  I have reviewed the triage vital signs and the nursing notes.  Pertinent labs & imaging results that were available during my care of the patient were reviewed by me and considered in my medical decision making (see chart for details).  Reviewed nursing notes and prior charts for additional history.  xrays done at urgent care reviewed - no obvious osteo.   Labs sent. Iv ns bolus.   Cultures sent.   Iv abx given.  Medicine service consulted for admission.   Labs reviewed - glucose high, hco3 low c/w dka.   Additional ns bolus.   Insulin gtt via glucostabilizer.  CRITICAL CARE  RE: infected diabetic foot, r/o sepsis, fever, uncontrolled hyperglycemia, dehydration, dka.   Performed by: Suzi Roots Total critical care time: 45 minutes Critical care time was exclusive of separately billable procedures and treating other patients. Critical care was necessary to treat or prevent imminent or life-threatening deterioration. Critical care was time spent personally by me on the following activities: development of treatment plan with patient and/or surrogate as well as nursing, discussions with consultants, evaluation of patient's response to treatment, examination of patient, obtaining history from patient or surrogate, ordering and performing treatments and interventions, ordering and review of laboratory studies, ordering and review of radiographic studies, pulse oximetry and re-evaluation of patient's condition.   Final Clinical Impressions(s) / ED Diagnoses   Final diagnoses:  None    ED Discharge Orders    None       Cathren Laine, MD 09/27/18 1356

## 2018-09-27 NOTE — ED Notes (Signed)
cbg 304

## 2018-09-27 NOTE — ED Notes (Signed)
Attempted to call report x 1  

## 2018-09-27 NOTE — ED Triage Notes (Signed)
Pt c/o fever, chills and body aches off and on since Monday. Took tylenol at 830 this am. Also c/o rt big toe pain where toe nail came off, top of foot is red and hot to touch

## 2018-09-27 NOTE — H&P (Signed)
[facility]                                                                                    Patient Demographics  Ian Woodard, is a 48 y.o. male  CSN: [id number]  MRN: [id number]  DOB - Dec 03, 1970  Admit Date - 09/27/2018  Outpatient Primary MD for the patient is [name], MD   With History of -  Past Medical History:  Diagnosis Date  . Diabetes mellitus (HCC)       Past Surgical History:  Procedure Laterality Date  . HERNIA REPAIR  1995  . TONSILLECTOMY  2004    in for   Chief Complaint  Patient presents with  . Wound Infection    right great toe     HPI  Ian Woodard  is a 48 y.o. male, with past medical history significant for insulin dependent diabetes mellitus(type I) presenting with 2 days history of right foot redness with fever and chills.  Patient has been trying to control his diabetes mellitus with diet and is not compliant with medications.  Around 2 weeks ago he ripped  off his right toenail but did not notice anything until 2 days ago.  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  Patient denies any chest pains, shortness of breath, nausea or vomiting.  Patient denies any pain in his right foot.  In the emergency room the patient was noted to be in mild DKA    Review of Systems    In addition to the HPI above,  No Fever-chills, No Headache, No changes with Vision or hearing, No problems swallowing food or Liquids, No Chest pain, Cough or Shortness of Breath, No Abdominal pain, No Nausea or Vommitting, Bowel movements are regular, No Blood in stool or Urine, No dysuria, No new joints pains-aches,  No new weakness, tingling, numbness in any extremity, No recent weight gain or loss, No significant Mental Stressors.  A full 10 point Review of Systems was done, except as stated above, all other Review of Systems were negative.   Social History Social History   Tobacco Use  . Smoking status: Never Smoker  .  Smokeless tobacco: Never Used  Substance Use Topics  . Alcohol use: Yes    Alcohol/week: 1.0 standard drinks    Types: 1 Glasses of wine per week     Family History Family History  Problem Relation Age of Onset  . Hypertension Mother   . Hypertension Father   . Aneurysm Father   . Breast cancer Maternal Grandmother   . Alcohol abuse Neg Hx   . Cancer Neg Hx   . COPD Neg Hx   . Diabetes Neg Hx   . Early death Neg Hx   . Drug abuse Neg Hx   . Hearing loss Neg Hx   . Heart disease Neg Hx   . Hyperlipidemia Neg Hx   . Kidney disease Neg Hx   . Stroke Neg Hx      Prior to Admission medications   Medication Sig Start Date End Date Taking? Authorizing Provider  psyllium (METAMUCIL) 58.6 % packet Take 1 packet by mouth  daily.   Yes [provider]  docusate sodium (COLACE) 100 MG capsule Take 1 capsule (100 mg total) by mouth every 12 (twelve) hours. Patient not taking: Reported on 09/27/2018 02/15/18   Horton, Mayer Masker, MD  HYDROcodone-homatropine [facility]) 5-1.5 MG/5ML syrup Take 5 mLs by mouth every 8 (eight) hours as needed for cough. Patient not taking: Reported on 02/15/2018 03/18/15   [name], MD  pantoprazole (PROTONIX) 40 MG tablet Take 1 tablet (40 mg total) by mouth 2 (two) times daily. Patient not taking: Reported on 02/15/2018 05/22/15   Benancio Deeds, MD  polyethylene glycol-electrolytes (NULYTELY/GOLYTELY) 420 g solution Take 8 oz every 15-30 minutes until finished Patient not taking: Reported on 09/27/2018 02/15/18   Horton, Mayer Masker, MD  sodium phosphate (FLEET) 7-19 GM/118ML ENEM Place 133 mLs (1 enema total) rectally daily as needed for severe constipation. Patient not taking: Reported on 09/27/2018 02/15/18   Horton, Mayer Masker, MD    Allergies  Allergen Reactions  . Ibuprofen Other (See Comments)    constipation    Physical Exam  Vitals  Blood pressure 127/87, pulse 98, temperature 98.4 F (36.9 C), temperature source Oral, resp.  rate 18, height 5\' 11"  (1.803 m), weight 74.8 kg, SpO2 100 %.   1. General Young male, well-developed, well-nourished, extremely pleasant  2. Normal affect and insight, Not Suicidal or Homicidal, Awake Alert, Oriented X 3.  3. No F.N deficits, grossly, patient moving all extremities.  4. Ears and Eyes appear Normal, Conjunctivae clear, PERRLA. Moist Oral Mucosa.  5. Supple Neck, No JVD, No cervical lymphadenopathy appriciated, No Carotid Bruits.  6. Symmetrical Chest wall movement, Good air movement bilaterally, CTAB.  7. RRR, No Gallops, Rubs or Murmurs, No Parasternal Heave.  8. Positive Bowel Sounds, Abdomen Soft, Non tender, No organomegaly appriciated,No rebound -guarding or rigidity.  9.  No Cyanosis, Normal Skin Turgor, right foot redness, induration and discoloration at the level of first toe with right toenail damage noted.  No sensations noted in his right foot  10. Good muscle tone,  joints appear normal , no effusions, Normal ROM.    Data Review  CBC Recent Labs  Lab 09/27/18 1320  WBC 13.6*  HGB 14.8  HCT 43.3  PLT 330  MCV 84.2  MCH 28.8  MCHC 34.2  RDW 12.2  LYMPHSABS 1.0  MONOABS 1.4*  EOSABS 0.1  BASOSABS 0.0   ------------------------------------------------------------------------------------------------------------------  Chemistries  Recent Labs  Lab 09/27/18 1320  NA 128*  K 4.0  CL 95*  CO2 14*  GLUCOSE 311*  BUN 12  CREATININE 1.08  CALCIUM 8.7*  AST 13*  ALT 9  ALKPHOS 95  BILITOT 1.3*   ------------------------------------------------------------------------------------------------------------------ estimated creatinine clearance is 88.5 mL/min (by C-G formula based on SCr of 1.08 mg/dL). ------------------------------------------------------------------------------------------------------------------ No results for input(s): TSH, T4TOTAL, T3FREE, THYROIDAB in the last 72 hours.  Invalid input(s): FREET3   Coagulation  profile No results for input(s): INR, PROTIME in the last 168 hours. ------------------------------------------------------------------------------------------------------------------- No results for input(s): DDIMER in the last 72 hours. -------------------------------------------------------------------------------------------------------------------  Cardiac Enzymes No results for input(s): CKMB, TROPONINI, MYOGLOBIN in the last 168 hours.  Invalid input(s): CK ------------------------------------------------------------------------------------------------------------------ Invalid input(s): POCBNP   ---------------------------------------------------------------------------------------------------------------  Urinalysis No results found for: COLORURINE, APPEARANCEUR, LABSPEC, PHURINE, GLUCOSEU, HGBUR, BILIRUBINUR, KETONESUR, PROTEINUR, UROBILINOGEN, NITRITE, LEUKOCYTESUR  ----------------------------------------------------------------------------------------------------------------   Imaging results:   Dg Foot Complete Right  Result Date: 09/27/2018 CLINICAL DATA:  Great toenail ripped off 1-2 weeks ago, redness and swelling question osteomyelitis EXAM: RIGHT  FOOT COMPLETE - 3+ VIEW COMPARISON:  None. FINDINGS: Soft tissue swelling great toe. Osseous mineralization normal. Joint space narrowing at first MTP joint with spur formation. Remaining joint spaces preserved. No acute fracture, dislocation, or bone destruction. Specifically no radiographic evidence of osteomyelitis at the great toe. IMPRESSION: No acute osseous abnormalities. Degenerative changes first MTP joint. Electronically Signed   By: Ulyses SouthwardMark  Boles M.D.   On: 09/27/2018 12:03      Assessment & Plan  Diabetes mellitus type 1 with DKA; not on any medications Glucose stabilizer started in the emergency room Consult social service to help with medication cost Diabetic teaching done and will have to be continued in  hospital  Right foot cellulitis/abscess; diabetic IV vancomycin and Zosyn  Diabetic neuropathy No need for treatment at this time   DVT Prophylaxis Lovenox  AM Labs Ordered, also please review Full Orders  Family Communication: Admission, patients condition and plan of care including tests being ordered have been discussed with the patient and mother who indicate understanding and agree with the plan and Code Status.  Code Status full  Disposition Plan: Home  Time spent in minutes : 44 minutes  Condition GUARDED   @SIGNATURE @

## 2018-09-28 ENCOUNTER — Inpatient Hospital Stay (HOSPITAL_COMMUNITY): Payer: Self-pay

## 2018-09-28 DIAGNOSIS — E871 Hypo-osmolality and hyponatremia: Secondary | ICD-10-CM

## 2018-09-28 DIAGNOSIS — E1110 Type 2 diabetes mellitus with ketoacidosis without coma: Secondary | ICD-10-CM

## 2018-09-28 DIAGNOSIS — D72829 Elevated white blood cell count, unspecified: Secondary | ICD-10-CM

## 2018-09-28 DIAGNOSIS — E1142 Type 2 diabetes mellitus with diabetic polyneuropathy: Secondary | ICD-10-CM

## 2018-09-28 LAB — BASIC METABOLIC PANEL
Anion gap: 10 (ref 5–15)
Anion gap: 13 (ref 5–15)
BUN: 8 mg/dL (ref 6–20)
BUN: 8 mg/dL (ref 6–20)
CO2: 19 mmol/L — ABNORMAL LOW (ref 22–32)
CO2: 18 mmol/L — ABNORMAL LOW (ref 22–32)
Calcium: 8.1 mg/dL — ABNORMAL LOW (ref 8.9–10.3)
Calcium: 8 mg/dL — ABNORMAL LOW (ref 8.9–10.3)
Chloride: 102 mmol/L (ref 98–111)
Chloride: 100 mmol/L (ref 98–111)
Creatinine, Ser: 0.7 mg/dL (ref 0.61–1.24)
Creatinine, Ser: 0.76 mg/dL (ref 0.61–1.24)
GFR calc Af Amer: >60 mL/min (ref >60)
GFR calc Af Amer: >60 mL/min (ref >60)
GFR calc non Af Amer: >60 mL/min (ref >60)
GFR calc non Af Amer: >60 mL/min (ref >60)
Glucose, Bld: 138 mg/dL — ABNORMAL HIGH (ref 70–99)
Glucose, Bld: 189 mg/dL — ABNORMAL HIGH (ref 70–99)
Potassium: 3.6 mmol/L (ref 3.5–5.1)
Potassium: 3.5 mmol/L (ref 3.5–5.1)
Sodium: 131 mmol/L — ABNORMAL LOW (ref 135–145)
Sodium: 131 mmol/L — ABNORMAL LOW (ref 135–145)

## 2018-09-28 LAB — GLUCOSE, CAPILLARY
GLUCOSE-CAPILLARY: 127 mg/dL — AB (ref 70–99)
GLUCOSE-CAPILLARY: 147 mg/dL — AB (ref 70–99)
Glucose-Capillary: 102 mg/dL — ABNORMAL HIGH (ref 70–99)
Glucose-Capillary: 114 mg/dL — ABNORMAL HIGH (ref 70–99)
Glucose-Capillary: 121 mg/dL — ABNORMAL HIGH (ref 70–99)
Glucose-Capillary: 224 mg/dL — ABNORMAL HIGH (ref 70–99)
Glucose-Capillary: 162 mg/dL — ABNORMAL HIGH (ref 70–99)
Glucose-Capillary: 142 mg/dL — ABNORMAL HIGH (ref 70–99)
Glucose-Capillary: 187 mg/dL — ABNORMAL HIGH (ref 70–99)
Glucose-Capillary: 233 mg/dL — ABNORMAL HIGH (ref 70–99)
Glucose-Capillary: 198 mg/dL — ABNORMAL HIGH (ref 70–99)
Glucose-Capillary: 140 mg/dL — ABNORMAL HIGH (ref 70–99)
Glucose-Capillary: 198 mg/dL — ABNORMAL HIGH (ref 70–99)

## 2018-09-28 LAB — MRSA PCR SCREENING: MRSA by PCR: NEGATIVE

## 2018-09-28 LAB — URIC ACID: Uric Acid, Serum: 2.7 mg/dL — ABNORMAL LOW (ref 3.7–8.6)

## 2018-09-28 LAB — HIV ANTIBODY (ROUTINE TESTING W REFLEX): HIV Screen 4th Generation wRfx: NONREACTIVE

## 2018-09-28 MED ORDER — GADOBUTROL 1 MMOL/ML IV SOLN
7.0000 mL | Freq: Once | INTRAVENOUS | Status: AC | PRN
  Administered 2018-09-28: 7 mL via INTRAVENOUS

## 2018-09-28 MED ORDER — INSULIN ASPART 100 UNIT/ML ~~LOC~~ SOLN
0.0000 [IU] | Freq: Three times a day (TID) | SUBCUTANEOUS | Status: DC
  Administered 2018-09-29: 5 [IU] via SUBCUTANEOUS
  Administered 2018-09-29: 3 [IU] via SUBCUTANEOUS
  Administered 2018-09-29: 8 [IU] via SUBCUTANEOUS
  Administered 2018-09-30: 5 [IU] via SUBCUTANEOUS
  Administered 2018-09-30 – 2018-10-01 (×3): 8 [IU] via SUBCUTANEOUS
  Administered 2018-10-01 – 2018-10-02 (×3): 5 [IU] via SUBCUTANEOUS
  Administered 2018-10-02 – 2018-10-03 (×3): 3 [IU] via SUBCUTANEOUS
  Administered 2018-10-03: 2 [IU] via SUBCUTANEOUS

## 2018-09-28 MED ORDER — INSULIN DETEMIR 100 UNIT/ML ~~LOC~~ SOLN
20.0000 [IU] | SUBCUTANEOUS | Status: DC
  Administered 2018-09-28: 20 [IU] via SUBCUTANEOUS
  Filled 2018-09-28 (×2): qty 0.2

## 2018-09-28 MED ORDER — VANCOMYCIN HCL IN DEXTROSE 1-5 GM/200ML-% IV SOLN
1000.0000 mg | Freq: Two times a day (BID) | INTRAVENOUS | Status: DC
  Administered 2018-09-28 – 2018-10-02 (×8): 1000 mg via INTRAVENOUS
  Filled 2018-09-28 (×9): qty 200

## 2018-09-28 MED ORDER — INSULIN ASPART 100 UNIT/ML ~~LOC~~ SOLN
0.0000 [IU] | Freq: Every day | SUBCUTANEOUS | Status: DC
  Administered 2018-09-29 – 2018-09-30 (×2): 2 [IU] via SUBCUTANEOUS

## 2018-09-28 NOTE — H&P (View-Only) (Signed)
ORTHOPAEDIC CONSULTATION  REQUESTING PHYSICIAN: Glade Lloyd, MD  Chief Complaint: Ulceration right great toe.  HPI: Ian Woodard is a 48 y.o. male who presents with diabetic insensate neuropathy also a history of gout he states he has had 5 gout attacks in the past and this feels nothing like previous attacks.  Patient states he had cellulitis up to his ankle and he states there is been rapid improvement with the IV antibiotics.  Past Medical History:  Diagnosis Date  . Diabetes mellitus (HCC)    Past Surgical History:  Procedure Laterality Date  . HERNIA REPAIR  1995  . TONSILLECTOMY  2004   Social History   Socioeconomic History  . Marital status: Single    Spouse name: Not on file  . Number of children: Not on file  . Years of education: Not on file  . Highest education level: Not on file  Occupational History  . Occupation: Chef  Social Needs  . Financial resource strain: Not on file  . Food insecurity:    Worry: Not on file    Inability: Not on file  . Transportation needs:    Medical: Not on file    Non-medical: Not on file  Tobacco Use  . Smoking status: Never Smoker  . Smokeless tobacco: Never Used  Substance and Sexual Activity  . Alcohol use: Yes    Alcohol/week: 1.0 standard drinks    Types: 1 Glasses of wine per week  . Drug use: No  . Sexual activity: Not Currently  Lifestyle  . Physical activity:    Days per week: Not on file    Minutes per session: Not on file  . Stress: Not on file  Relationships  . Social connections:    Talks on phone: Not on file    Gets together: Not on file    Attends religious service: Not on file    Active member of club or organization: Not on file    Attends meetings of clubs or organizations: Not on file    Relationship status: Not on file  Other Topics Concern  . Not on file  Social History Narrative  . Not on file   Family History  Problem Relation Age of Onset  . Hypertension Mother   .  Hypertension Father   . Aneurysm Father   . Breast cancer Maternal Grandmother   . Alcohol abuse Neg Hx   . Cancer Neg Hx   . COPD Neg Hx   . Diabetes Neg Hx   . Early death Neg Hx   . Drug abuse Neg Hx   . Hearing loss Neg Hx   . Heart disease Neg Hx   . Hyperlipidemia Neg Hx   . Kidney disease Neg Hx   . Stroke Neg Hx    - negative except otherwise stated in the family history section Allergies  Allergen Reactions  . Ibuprofen Other (See Comments)    constipation   Prior to Admission medications   Medication Sig Start Date End Date Taking? Authorizing Provider  psyllium (METAMUCIL) 58.6 % packet Take 1 packet by mouth daily.   Yes [provider]  docusate sodium (COLACE) 100 MG capsule Take 1 capsule (100 mg total) by mouth every 12 (twelve) hours. Patient not taking: Reported on 09/27/2018 02/15/18   Horton, Mayer Masker, MD  HYDROcodone-homatropine [facility]) 5-1.5 MG/5ML syrup Take 5 mLs by mouth every 8 (eight) hours as needed for cough. Patient not taking: Reported on 02/15/2018 03/18/15  [name], MD  pantoprazole (PROTONIX) 40 MG tablet Take 1 tablet (40 mg total) by mouth 2 (two) times daily. Patient not taking: Reported on 02/15/2018 05/22/15   Benancio Deeds, MD  polyethylene glycol-electrolytes (NULYTELY/GOLYTELY) 420 g solution Take 8 oz every 15-30 minutes until finished Patient not taking: Reported on 09/27/2018 02/15/18   Horton, Mayer Masker, MD  sodium phosphate (FLEET) 7-19 GM/118ML ENEM Place 133 mLs (1 enema total) rectally daily as needed for severe constipation. Patient not taking: Reported on 09/27/2018 02/15/18   Shon Baton, MD   Dg Foot Complete Right  Result Date: 09/27/2018 CLINICAL DATA:  Great toenail ripped off 1-2 weeks ago, redness and swelling question osteomyelitis EXAM: RIGHT FOOT COMPLETE - 3+ VIEW COMPARISON:  None. FINDINGS: Soft tissue swelling great toe. Osseous mineralization normal. Joint space narrowing at first MTP  joint with spur formation. Remaining joint spaces preserved. No acute fracture, dislocation, or bone destruction. Specifically no radiographic evidence of osteomyelitis at the great toe. IMPRESSION: No acute osseous abnormalities. Degenerative changes first MTP joint. Electronically Signed   By: Ulyses Southward M.D.   On: 09/27/2018 12:03   - pertinent xrays, CT, MRI studies were reviewed and independently interpreted  Positive ROS: All other systems have been reviewed and were otherwise negative with the exception of those mentioned in the HPI and as above.  Physical Exam: General: Alert, no acute distress Psychiatric: Patient is competent for consent with normal mood and affect Lymphatic: No axillary or cervical lymphadenopathy Cardiovascular: No pedal edema Respiratory: No cyanosis, no use of accessory musculature GI: No organomegaly, abdomen is soft and non-tender    Images:  @ENCIMAGES @  Labs:  Lab Results  Component Value Date   LABURIC 2.7 (L) 09/28/2018    Lab Results  Component Value Date   ALBUMIN 3.5 09/27/2018   LABURIC 2.7 (L) 09/28/2018    Neurologic: Patient does not have protective sensation bilateral lower extremities.   MUSCULOSKELETAL:   Skin: Examination patient has necrotic ulcers over the medial and plantar aspect of the right great toe.  I cannot express any purulence there is no tophaceous deposits no purulent drainage there is no fluctuance or sausage digit swelling in the toe there is minimal tenderness to palpation.  The cellulitis in the foot has essentially completely resolved.  There is no paronychial infection.  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  No significant periarticular cysts no signs of chronic gout.  Uric acid is 2.7.  Patient states that his last hemoglobin A1c was in the fives.  Assessment: Assessment: Diabetic insensate neuropathy with history of gout with a necrotic ulcer  plantar and medial of the right great toe.  With significant improvement of the cellulitis of his foot.  Plan: Plan: Recommend continuing IV antibiotics I will review the MRI scan when it is available.  Discussed with the patient that the necrotic tissue would not heal a surgical incision and have recommended not proceeding with an irrigation and debridement at this time until we better understand any involvement with possible osteomyelitis.  Discussed that with osteomyelitis and amputation of the great toe is an option.  I will follow-up Friday morning.  Thank you for the consult and the opportunity to see Mr. Nelwyn Salisbury, MD [facility] [phone] 12:51 PM

## 2018-09-28 NOTE — Consult Note (Signed)
ORTHOPAEDIC CONSULTATION  REQUESTING PHYSICIAN: Glade Lloyd, MD  Chief Complaint: Ulceration right great toe.  HPI: Ian Woodard is a 48 y.o. male who presents with diabetic insensate neuropathy also a history of gout he states he has had 5 gout attacks in the past and this feels nothing like previous attacks.  Patient states he had cellulitis up to his ankle and he states there is been rapid improvement with the IV antibiotics.  Past Medical History:  Diagnosis Date  . Diabetes mellitus (HCC)    Past Surgical History:  Procedure Laterality Date  . HERNIA REPAIR  1995  . TONSILLECTOMY  2004   Social History   Socioeconomic History  . Marital status: Single    Spouse name: Not on file  . Number of children: Not on file  . Years of education: Not on file  . Highest education level: Not on file  Occupational History  . Occupation: Chef  Social Needs  . Financial resource strain: Not on file  . Food insecurity:    Worry: Not on file    Inability: Not on file  . Transportation needs:    Medical: Not on file    Non-medical: Not on file  Tobacco Use  . Smoking status: Never Smoker  . Smokeless tobacco: Never Used  Substance and Sexual Activity  . Alcohol use: Yes    Alcohol/week: 1.0 standard drinks    Types: 1 Glasses of wine per week  . Drug use: No  . Sexual activity: Not Currently  Lifestyle  . Physical activity:    Days per week: Not on file    Minutes per session: Not on file  . Stress: Not on file  Relationships  . Social connections:    Talks on phone: Not on file    Gets together: Not on file    Attends religious service: Not on file    Active member of club or organization: Not on file    Attends meetings of clubs or organizations: Not on file    Relationship status: Not on file  Other Topics Concern  . Not on file  Social History Narrative  . Not on file   Family History  Problem Relation Age of Onset  . Hypertension Mother   .  Hypertension Father   . Aneurysm Father   . Breast cancer Maternal Grandmother   . Alcohol abuse Neg Hx   . Cancer Neg Hx   . COPD Neg Hx   . Diabetes Neg Hx   . Early death Neg Hx   . Drug abuse Neg Hx   . Hearing loss Neg Hx   . Heart disease Neg Hx   . Hyperlipidemia Neg Hx   . Kidney disease Neg Hx   . Stroke Neg Hx    - negative except otherwise stated in the family history section Allergies  Allergen Reactions  . Ibuprofen Other (See Comments)    constipation   Prior to Admission medications   Medication Sig Start Date End Date Taking? Authorizing Provider  psyllium (METAMUCIL) 58.6 % packet Take 1 packet by mouth daily.   Yes [provider]  docusate sodium (COLACE) 100 MG capsule Take 1 capsule (100 mg total) by mouth every 12 (twelve) hours. Patient not taking: Reported on 09/27/2018 02/15/18   Horton, Mayer Masker, MD  HYDROcodone-homatropine [facility]) 5-1.5 MG/5ML syrup Take 5 mLs by mouth every 8 (eight) hours as needed for cough. Patient not taking: Reported on 02/15/2018 03/18/15  Jones, Thomas L, MD  pantoprazole (PROTONIX) 40 MG tablet Take 1 tablet (40 mg total) by mouth 2 (two) times daily. Patient not taking: Reported on 02/15/2018 05/22/15   Armbruster, Steven P, MD  polyethylene glycol-electrolytes (NULYTELY/GOLYTELY) 420 g solution Take 8 oz every 15-30 minutes until finished Patient not taking: Reported on 09/27/2018 02/15/18   Horton, Courtney F, MD  sodium phosphate (FLEET) 7-19 GM/118ML ENEM Place 133 mLs (1 enema total) rectally daily as needed for severe constipation. Patient not taking: Reported on 09/27/2018 02/15/18   Horton, Courtney F, MD   Dg Foot Complete Right  Result Date: 09/27/2018 CLINICAL DATA:  Great toenail ripped off 1-2 weeks ago, redness and swelling question osteomyelitis EXAM: RIGHT FOOT COMPLETE - 3+ VIEW COMPARISON:  None. FINDINGS: Soft tissue swelling great toe. Osseous mineralization normal. Joint space narrowing at first MTP  joint with spur formation. Remaining joint spaces preserved. No acute fracture, dislocation, or bone destruction. Specifically no radiographic evidence of osteomyelitis at the great toe. IMPRESSION: No acute osseous abnormalities. Degenerative changes first MTP joint. Electronically Signed   By: Mark  Boles M.D.   On: 09/27/2018 12:03   - pertinent xrays, CT, MRI studies were reviewed and independently interpreted  Positive ROS: All other systems have been reviewed and were otherwise negative with the exception of those mentioned in the HPI and as above.  Physical Exam: General: Alert, no acute distress Psychiatric: Patient is competent for consent with normal mood and affect Lymphatic: No axillary or cervical lymphadenopathy Cardiovascular: No pedal edema Respiratory: No cyanosis, no use of accessory musculature GI: No organomegaly, abdomen is soft and non-tender    Images:  @ENCIMAGES@  Labs:  Lab Results  Component Value Date   LABURIC 2.7 (L) 09/28/2018    Lab Results  Component Value Date   ALBUMIN 3.5 09/27/2018   LABURIC 2.7 (L) 09/28/2018    Neurologic: Patient does not have protective sensation bilateral lower extremities.   MUSCULOSKELETAL:   Skin: Examination patient has necrotic ulcers over the medial and plantar aspect of the right great toe.  I cannot express any purulence there is no tophaceous deposits no purulent drainage there is no fluctuance or sausage digit swelling in the toe there is minimal tenderness to palpation.  The cellulitis in the foot has essentially completely resolved.  There is no paronychial infection.  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  No significant periarticular cysts no signs of chronic gout.  Uric acid is 2.7.  Patient states that his last hemoglobin A1c was in the fives.  Assessment: Assessment: Diabetic insensate neuropathy with history of gout with a necrotic ulcer  plantar and medial of the right great toe.  With significant improvement of the cellulitis of his foot.  Plan: Plan: Recommend continuing IV antibiotics I will review the MRI scan when it is available.  Discussed with the patient that the necrotic tissue would not heal a surgical incision and have recommended not proceeding with an irrigation and debridement at this time until we better understand any involvement with possible osteomyelitis.  Discussed that with osteomyelitis and amputation of the great toe is an option.  I will follow-up Friday morning.  Thank you for the consult and the opportunity to see Mr. Beth  Kamaron Deskins, MD [facility] [phone] 12:51 PM     

## 2018-09-28 NOTE — Plan of Care (Signed)
  Problem: Education: Goal: Knowledge of General Education information will improve Description: Including pain rating scale, medication(s)/side effects and non-pharmacologic comfort measures Outcome: Progressing   Problem: Activity: Goal: Risk for activity intolerance will decrease Outcome: Progressing   Problem: Nutrition: Goal: Adequate nutrition will be maintained Outcome: Progressing   

## 2018-09-28 NOTE — Progress Notes (Signed)
Patient ID: Ian Woodard, male   DOB: 09-27-1970, 48 y.o.   MRN: [id number]  PROGRESS NOTE    Ian Woodard  SQZ:[id number] DOB: June 23, 1971 DOA: 09/27/2018 PCP: [name], MD   Brief Narrative:  48 year old male with history of diabetes mellitus not on any medications at home presented with right foot redness with fevers and chills.  He was admitted with DKA along with right foot cellulitis and started on insulin drip and IV antibiotics.  Assessment & Plan:   Active Problems:   Cellulitis and abscess of foot  DKA in a patient with history of diabetes mellitus probably type II not on any medications at home -Started on insulin drip and IV fluids.  Blood sugars have much improved.  Will transition to long-acting subcutaneous insulin and subsequently turn off the insulin drip.  Continue Accu-Cheks with coverage.  Diabetes coordinator consult.  Carb modified diet.  Follow-up hemoglobin A1c.  Right foot cellulitis with concern for abscess versus osteomyelitis -X-ray was negative for osteomyelitis.  I have consulted orthopedics in case patient needs surgical intervention.  Currently on vancomycin and Zosyn.  Will continue only vancomycin.  Hyponatremia -Probably from dehydration.  Sodium improving.  Monitor  Leukocytosis -Probably from cellulitis.  Monitor    DVT prophylaxis: Lovenox Code Status: Full Family Communication: None at bedside Disposition Plan: Depends on clinical outcome  Consultants: Orthopedics  Procedures: None  Antimicrobials: Vancomycin and Zosyn from 09/27/2018 onwards   Subjective: Patient seen and examined at bedside.  He feels that his foot swelling and pain is improving.  He is hungry.  Currently not nauseous or vomiting.  Still having fevers.  Objective: Vitals:   09/27/18 1900 09/27/18 2000 09/28/18 0036 09/28/18 0436  BP: 108/72 116/78 100/68 103/72  Pulse: (!) 110 (!) 104 93 86  Resp:  19  (!) 23  Temp:  (!) 101.7 F (38.7 C) 98.8 F (37.1  C) (!) 100.5 F (38.1 C)  TempSrc:  Oral Oral Oral  SpO2: 97% 98% 97% 96%  Weight:      Height:        Intake/Output Summary (Last 24 hours) at 09/28/2018 1051 Last data filed at 09/28/2018 0519 Gross per 24 hour  Intake 1454.84 ml  Output 700 ml  Net 754.84 ml   Filed Weights   09/27/18 1238 09/27/18 1245  Weight: 74.8 kg 74.8 kg    Examination:  General exam: Appears calm and comfortable  Respiratory system: Bilateral decreased breath sounds at bases Cardiovascular system: S1 & S2 heard, intermittent tachycardia Gastrointestinal system: Abdomen is nondistended, soft and nontender. Normal bowel sounds heard. Extremities: No cyanosis, clubbing; right lower extremity edema. Central nervous system: Alert and oriented. No focal neurological deficits. Moving extremities Skin: Right foot skin is erythematous, tender with swelling more redness right great toe.  No discharge. Psychiatry: Judgement and insight appear normal. Mood & affect appropriate.     Data Reviewed: I have personally reviewed following labs and imaging studies  CBC: Recent Labs  Lab 09/27/18 1320  WBC 13.6*  NEUTROABS 11.0*  HGB 14.8  HCT 43.3  MCV 84.2  PLT 330   Basic Metabolic Panel: Recent Labs  Lab 09/27/18 1320 09/28/18 0400 09/28/18 0538  NA 128* 131* 131*  K 4.0 3.6 3.5  CL 95* 102 100  CO2 14* 19* 18*  GLUCOSE 311* 138* 189*  BUN 12 8 8   CREATININE 1.08 0.70 0.76  CALCIUM 8.7* 8.1* 8.0*   GFR: Estimated Creatinine Clearance: 119.5 mL/min (by C-G  formula based on SCr of 0.76 mg/dL). Liver Function Tests: Recent Labs  Lab 09/27/18 1320  AST 13*  ALT 9  ALKPHOS 95  BILITOT 1.3*  PROT 7.0  ALBUMIN 3.5   No results for input(s): LIPASE, AMYLASE in the last 168 hours. No results for input(s): AMMONIA in the last 168 hours. Coagulation Profile: No results for input(s): INR, PROTIME in the last 168 hours. Cardiac Enzymes: No results for input(s): CKTOTAL, CKMB, CKMBINDEX,  TROPONINI in the last 168 hours. BNP (last 3 results) No results for input(s): PROBNP in the last 8760 hours. HbA1C: No results for input(s): HGBA1C in the last 72 hours. CBG: Recent Labs  Lab 09/28/18 0516 09/28/18 0610 09/28/18 0713 09/28/18 0823 09/28/18 1006  GLUCAP 224* 162* 142* 187* 233*   Lipid Profile: No results for input(s): CHOL, HDL, LDLCALC, TRIG, CHOLHDL, LDLDIRECT in the last 72 hours. Thyroid Function Tests: No results for input(s): TSH, T4TOTAL, FREET4, T3FREE, THYROIDAB in the last 72 hours. Anemia Panel: No results for input(s): VITAMINB12, FOLATE, FERRITIN, TIBC, IRON, RETICCTPCT in the last 72 hours. Sepsis Labs: Recent Labs  Lab 09/27/18 1320 09/27/18 1448  LATICACIDVEN 1.3 1.7    Recent Results (from the past 240 hour(s))  MRSA PCR Screening     Status: None   Collection Time: 09/28/18  7:04 AM  Result Value Ref Range Status   MRSA by PCR NEGATIVE NEGATIVE Final    Comment:        The GeneXpert MRSA Assay (FDA approved for NASAL specimens only), is one component of a comprehensive MRSA colonization surveillance program. It is not intended to diagnose MRSA infection nor to guide or monitor treatment for MRSA infections. Performed at [facility], [address] [place], [place] [address]          Radiology Studies: Dg Foot Complete Right  Result Date: 09/27/2018 CLINICAL DATA:  Great toenail ripped off 1-2 weeks ago, redness and swelling question osteomyelitis EXAM: RIGHT FOOT COMPLETE - 3+ VIEW COMPARISON:  None. FINDINGS: Soft tissue swelling great toe. Osseous mineralization normal. Joint space narrowing at first MTP joint with spur formation. Remaining joint spaces preserved. No acute fracture, dislocation, or bone destruction. Specifically no radiographic evidence of osteomyelitis at the great toe. IMPRESSION: No acute osseous abnormalities. Degenerative changes first MTP joint. Electronically Signed   By: Ulyses Southward M.D.    On: 09/27/2018 12:03        Scheduled Meds: . enoxaparin (LOVENOX) injection  40 mg Subcutaneous Q24H  . insulin aspart  0-15 Units Subcutaneous TID WC  . insulin aspart  0-5 Units Subcutaneous QHS  . insulin detemir  20 Units Subcutaneous Q24H  . sodium chloride flush  3 mL Intravenous Once   Continuous Infusions: . sodium chloride 75 mL/hr at 09/27/18 1742  . dextrose 5 % and 0.45% NaCl 75 mL/hr at 09/28/18 0614  . insulin 6.9 Units/hr (09/28/18 1011)  . piperacillin-tazobactam (ZOSYN)  IV 3.375 g (09/28/18 0252)  . vancomycin 1,000 mg (09/28/18 0858)     LOS: 1 day        Glade Lloyd, MD [facility] 09/28/2018, 10:51 AM

## 2018-09-28 NOTE — Progress Notes (Signed)
Pharmacy Antibiotic Note  Ian Woodard is a 48 y.o. male admitted on 09/27/2018 with cellulitis due to his ripped off nail. He has no hx of cellulitis or foot infection. D/w with Dr. Hanley Ben today to optimize his abx to just vanc to cover for staph and strep. Ortho will take to get I&D.  Scr <1  Plan: Zosyn Vancomycin 1000 mg IV Q 12 hrs. Goal AUC 400-550. Expected AUC: 474 SCr used: 1.08   Height: 5\' 11"  (180.3 cm) Weight: 165 lb (74.8 kg) IBW/kg (Calculated) : 75.3  Temp (24hrs), Avg:99.9 F (37.7 C), Min:98.2 F (36.8 C), Max:101.9 F (38.8 C)  Recent Labs  Lab 09/27/18 1320 09/27/18 1448 09/28/18 0400 09/28/18 0538  WBC 13.6*  --   --   --   CREATININE 1.08  --  0.70 0.76  LATICACIDVEN 1.3 1.7  --   --     Estimated Creatinine Clearance: 119.5 mL/min (by C-G formula based on SCr of 0.76 mg/dL).    Allergies  Allergen Reactions  . Ibuprofen Other (See Comments)    constipation    2/26 vanc>>   Ulyses Southward, PharmD, [place], AAHIVP, CPP Infectious Disease Pharmacist 09/28/2018 11:07 AM

## 2018-09-28 NOTE — Progress Notes (Signed)
Inpatient Diabetes Program Recommendations  AACE/ADA: New Consensus Statement on Inpatient Glycemic Control (2015)  Target Ranges:  Prepandial:   less than 140 mg/dL      Peak postprandial:   less than 180 mg/dL (1-2 hours)      Critically ill patients:  140 - 180 mg/dL   Review of Glycemic Control  Diabetes history: DM 2  Spoke with patient about DM and glucose management at home. Patient reports he was diagnosed with DM type 2 back in 2001. Patient reports he lost weight and was able to come off of Metformin about 6 years ago. Patient reports getting his A1c consistently between 5-6%.  Patient is a Investment banker, operational. Patient reports not checking his glucose. Patient also mentions he has lost a lot of weight recently.   Discussed DKA with patient, discussed glucose control. Explained that he may need insulin for a short time depending on his A1c level for wound healing. Patient will need to start checking his glucose again. Patient reported he has operated a Humalog insulin pen sometime in the past and is familiar with how to do injections. Patient will need a refresher if he will need insulin at time of d/c.  Patient is mindful of how and what he eats and drinks at home.   Waiting on A1c value. Patient will be transitioned from IV insulin to Levemir 20 units, Novolog 0-15 units tid, Novolog 0-5 units qhs. Will monitor glucose trends. Will follow up with patient tomorrow.  Thanks,  Christena Deem RN, MSN, BC-ADM Inpatient Diabetes Coordinator Team Pager [phone] (8a-5p)

## 2018-09-28 NOTE — Consult Note (Signed)
Reason for Consult:Right foot cellulitis Referring Physician: Geannie Risen  [name] is an 48 y.o. male.  HPI: Ian Woodard came to the hospital with ~3d of right foot redness and swelling. He tore his right great toenail off a couple of weeks ago and thought that may have contributed. He developed fever and chills and came for evaluation. He notes the foot looks and feels better today. He denies prior hx/o foot ulcerations or infections.  Past Medical History:  Diagnosis Date  . Diabetes mellitus (HCC)     Past Surgical History:  Procedure Laterality Date  . HERNIA REPAIR  1995  . TONSILLECTOMY  2004    Family History  Problem Relation Age of Onset  . Hypertension Mother   . Hypertension Father   . Aneurysm Father   . Breast cancer Maternal Grandmother   . Alcohol abuse Neg Hx   . Cancer Neg Hx   . COPD Neg Hx   . Diabetes Neg Hx   . Early death Neg Hx   . Drug abuse Neg Hx   . Hearing loss Neg Hx   . Heart disease Neg Hx   . Hyperlipidemia Neg Hx   . Kidney disease Neg Hx   . Stroke Neg Hx     Social History:  reports that he has never smoked. He has never used smokeless tobacco. He reports current alcohol use of about 1.0 standard drinks of alcohol per week. He reports that he does not use drugs.  Allergies:  Allergies  Allergen Reactions  . Ibuprofen Other (See Comments)    constipation    Medications: I have reviewed the patient's current medications.  Results for orders placed or performed during the hospital encounter of 09/27/18 (from the past 48 hour(s))  CBG monitoring, ED     Status: Abnormal   Collection Time: 09/27/18 12:41 PM  Result Value Ref Range   Glucose-Capillary 295 (H) 70 - 99 mg/dL   Comment 1 Notify RN    Comment 2 Document in Chart   Lactic acid, plasma     Status: None   Collection Time: 09/27/18  1:20 PM  Result Value Ref Range   Lactic Acid, Venous 1.3 0.5 - 1.9 mmol/L    Comment: Performed at [facility], [address]  [place], [place] [address]  Comprehensive metabolic panel     Status: Abnormal   Collection Time: 09/27/18  1:20 PM  Result Value Ref Range   Sodium 128 (L) 135 - 145 mmol/L   Potassium 4.0 3.5 - 5.1 mmol/L   Chloride 95 (L) 98 - 111 mmol/L   CO2 14 (L) 22 - 32 mmol/L   Glucose, Bld 311 (H) 70 - 99 mg/dL   BUN 12 6 - 20 mg/dL   Creatinine, Ser 2.95 0.61 - 1.24 mg/dL   Calcium 8.7 (L) 8.9 - 10.3 mg/dL   Total Protein 7.0 6.5 - 8.1 g/dL   Albumin 3.5 3.5 - 5.0 g/dL   AST 13 (L) 15 - 41 U/L   ALT 9 0 - 44 U/L   Alkaline Phosphatase 95 38 - 126 U/L   Total Bilirubin 1.3 (H) 0.3 - 1.2 mg/dL   GFR calc non Af Amer >60 >60 mL/min   GFR calc Af Amer >60 >60 mL/min   Anion gap 19 (H) 5 - 15    Comment: Performed at [facility], [address] [place], [place] [address]  CBC with Differential     Status:  Abnormal   Collection Time: 09/27/18  1:20 PM  Result Value Ref Range   WBC 13.6 (H) 4.0 - 10.5 K/uL   RBC 5.14 4.22 - 5.81 MIL/uL   Hemoglobin 14.8 13.0 - 17.0 g/dL   HCT 16.1 09.6 - 04.5 %   MCV 84.2 80.0 - 100.0 fL   MCH 28.8 26.0 - 34.0 pg   MCHC 34.2 30.0 - 36.0 g/dL   RDW 40.9 81.1 - 91.4 %   Platelets 330 150 - 400 K/uL   nRBC 0.0 0.0 - 0.2 %   Neutrophils Relative % 80 %   Neutro Abs 11.0 (H) 1.7 - 7.7 K/uL   Lymphocytes Relative 7 %   Lymphs Abs 1.0 0.7 - 4.0 K/uL   Monocytes Relative 11 %   Monocytes Absolute 1.4 (H) 0.1 - 1.0 K/uL   Eosinophils Relative 1 %   Eosinophils Absolute 0.1 0.0 - 0.5 K/uL   Basophils Relative 0 %   Basophils Absolute 0.0 0.0 - 0.1 K/uL   Immature Granulocytes 1 %   Abs Immature Granulocytes 0.09 (H) 0.00 - 0.07 K/uL    Comment: Performed at [facility], [address] [place], [place] [address]  Lactic acid, plasma     Status: None   Collection Time: 09/27/18  2:48 PM  Result Value Ref Range   Lactic Acid, Venous 1.7 0.5 - 1.9 mmol/L    Comment: Performed at [facility], [address], [place], [place] [address]  CBG  monitoring, ED     Status: Abnormal   Collection Time: 09/27/18  4:17 PM  Result Value Ref Range   Glucose-Capillary 249 (H) 70 - 99 mg/dL  HIV antibody (Routine Testing)     Status: None   Collection Time: 09/27/18  5:21 PM  Result Value Ref Range   HIV Screen 4th Generation wRfx Non Reactive Non Reactive    Comment: (NOTE) Performed At: [facility] [address] [place], [place] [id number] Jolene Schimke MD EX:[id number]   CBG monitoring, ED     Status: Abnormal   Collection Time: 09/27/18  5:28 PM  Result Value Ref Range   Glucose-Capillary 231 (H) 70 - 99 mg/dL   Comment 1 Notify RN    Comment 2 Document in Chart   CBG monitoring, ED     Status: Abnormal   Collection Time: 09/27/18  6:25 PM  Result Value Ref Range   Glucose-Capillary 227 (H) 70 - 99 mg/dL  Glucose, capillary     Status: Abnormal   Collection Time: 09/27/18  8:19 PM  Result Value Ref Range   Glucose-Capillary 289 (H) 70 - 99 mg/dL  Glucose, capillary     Status: Abnormal   Collection Time: 09/27/18  9:10 PM  Result Value Ref Range   Glucose-Capillary 259 (H) 70 - 99 mg/dL  Glucose, capillary     Status: Abnormal   Collection Time: 09/27/18 10:09 PM  Result Value Ref Range   Glucose-Capillary 216 (H) 70 - 99 mg/dL  Glucose, capillary     Status: Abnormal   Collection Time: 09/27/18 11:15 PM  Result Value Ref Range   Glucose-Capillary 151 (H) 70 - 99 mg/dL   Comment 1 Notify RN    Comment 2 Document in Chart   Glucose, capillary     Status: Abnormal   Collection Time: 09/28/18 12:22 AM  Result Value Ref Range   Glucose-Capillary 147 (H) 70 - 99 mg/dL  Glucose, capillary     Status:  Abnormal   Collection Time: 09/28/18  1:14 AM  Result Value Ref Range   Glucose-Capillary 127 (H) 70 - 99 mg/dL  Glucose, capillary     Status: Abnormal   Collection Time: 09/28/18  2:11 AM  Result Value Ref Range   Glucose-Capillary 102 (H) 70 - 99 mg/dL  Glucose, capillary     Status: Abnormal   Collection  Time: 09/28/18  3:15 AM  Result Value Ref Range   Glucose-Capillary 114 (H) 70 - 99 mg/dL  Basic metabolic panel     Status: Abnormal   Collection Time: 09/28/18  4:00 AM  Result Value Ref Range   Sodium 131 (L) 135 - 145 mmol/L   Potassium 3.6 3.5 - 5.1 mmol/L   Chloride 102 98 - 111 mmol/L   CO2 19 (L) 22 - 32 mmol/L   Glucose, Bld 138 (H) 70 - 99 mg/dL   BUN 8 6 - 20 mg/dL   Creatinine, Ser 5.28 0.61 - 1.24 mg/dL   Calcium 8.1 (L) 8.9 - 10.3 mg/dL   GFR calc non Af Amer >60 >60 mL/min   GFR calc Af Amer >60 >60 mL/min   Anion gap 10 5 - 15    Comment: Performed at [facility], [address], [place], [place] [address]  Glucose, capillary     Status: Abnormal   Collection Time: 09/28/18  4:06 AM  Result Value Ref Range   Glucose-Capillary 121 (H) 70 - 99 mg/dL  Glucose, capillary     Status: Abnormal   Collection Time: 09/28/18  5:16 AM  Result Value Ref Range   Glucose-Capillary 224 (H) 70 - 99 mg/dL  Basic metabolic panel     Status: Abnormal   Collection Time: 09/28/18  5:38 AM  Result Value Ref Range   Sodium 131 (L) 135 - 145 mmol/L   Potassium 3.5 3.5 - 5.1 mmol/L   Chloride 100 98 - 111 mmol/L   CO2 18 (L) 22 - 32 mmol/L   Glucose, Bld 189 (H) 70 - 99 mg/dL   BUN 8 6 - 20 mg/dL   Creatinine, Ser 4.01 0.61 - 1.24 mg/dL   Calcium 8.0 (L) 8.9 - 10.3 mg/dL   GFR calc non Af Amer >60 >60 mL/min   GFR calc Af Amer >60 >60 mL/min   Anion gap 13 5 - 15    Comment: Performed at [facility], [address], [place], [place] [address]  Glucose, capillary     Status: Abnormal   Collection Time: 09/28/18  6:10 AM  Result Value Ref Range   Glucose-Capillary 162 (H) 70 - 99 mg/dL  Glucose, capillary     Status: Abnormal   Collection Time: 09/28/18  7:13 AM  Result Value Ref Range   Glucose-Capillary 142 (H) 70 - 99 mg/dL  Glucose, capillary     Status: Abnormal   Collection Time: 09/28/18  8:23 AM  Result Value Ref Range   Glucose-Capillary 187 (H) 70 - 99  mg/dL    Dg Foot Complete Right  Result Date: 09/27/2018 CLINICAL DATA:  Great toenail ripped off 1-2 weeks ago, redness and swelling question osteomyelitis EXAM: RIGHT FOOT COMPLETE - 3+ VIEW COMPARISON:  None. FINDINGS: Soft tissue swelling great toe. Osseous mineralization normal. Joint space narrowing at first MTP joint with spur formation. Remaining joint spaces preserved. No acute fracture, dislocation, or bone destruction. Specifically no radiographic evidence of osteomyelitis at the great toe. IMPRESSION: No acute osseous abnormalities. Degenerative changes first  MTP joint. Electronically Signed   By: Ulyses Southward M.D.   On: 09/27/2018 12:03    Review of Systems  Constitutional: Negative for chills, fever and weight loss.  HENT: Negative for ear discharge, ear pain, hearing loss and tinnitus.   Eyes: Negative for blurred vision, double vision, photophobia and pain.  Respiratory: Negative for cough, sputum production and shortness of breath.   Cardiovascular: Negative for chest pain.  Gastrointestinal: Negative for abdominal pain, nausea and vomiting.  Genitourinary: Negative for dysuria, flank pain, frequency and urgency.  Musculoskeletal: Negative for back pain, falls, joint pain, myalgias and neck pain.  Neurological: Negative for dizziness, tingling, sensory change, focal weakness, loss of consciousness and headaches.  Endo/Heme/Allergies: Does not bruise/bleed easily.  Psychiatric/Behavioral: Negative for depression, memory loss and substance abuse. The patient is not nervous/anxious.    Blood pressure 103/72, pulse 86, temperature (!) 100.5 F (38.1 C), temperature source Oral, resp. rate (!) 23, height 5\' 11"  (1.803 m), weight 74.8 kg, SpO2 96 %. Physical Exam  Constitutional: He appears well-developed and well-nourished. No distress.  HENT:  Head: Normocephalic and atraumatic.  Eyes: Conjunctivae are normal. Right eye exhibits no discharge. Left eye exhibits no discharge. No  scleral icterus.  Neck: Normal range of motion.  Cardiovascular: Normal rate and regular rhythm.  Respiratory: Effort normal. No respiratory distress.  Musculoskeletal:     Comments: RLE No traumatic wounds, ecchymosis, or rash  Erythema centered around 1st MTP joint, fusiform edema of the proximal great toe, superficial abscess noted laterally and extending around plantar surface of great toe  No knee or ankle effusion  Knee stable to varus/ valgus and anterior/posterior stress  Sens DPN, SPN, TN intact  Motor EHL, ext, flex, evers 5/5  DP 2+, PT 2+, Edema as noted above  Neurological: He is alert.  Skin: Skin is warm and dry. He is not diaphoretic.  Psychiatric: He has a normal mood and affect. His behavior is normal.    Assessment/Plan: Right foot cellulitis -- The abscess is likely epidermal but the swelling is concerning for osteo despite the negative x-ray. Will get MR to confirm no bony involvement. I agree he probably need I&D of the toe. Dr. Lajoyce Corners to evaluate, possible surgery tomorrow. DM   Freeman Caldron, PA-C Orthopedic Surgery [phone] 09/28/2018, 9:35 AM

## 2018-09-29 ENCOUNTER — Ambulatory Visit (INDEPENDENT_AMBULATORY_CARE_PROVIDER_SITE_OTHER): Payer: Self-pay | Admitting: Physician Assistant

## 2018-09-29 DIAGNOSIS — E876 Hypokalemia: Secondary | ICD-10-CM

## 2018-09-29 LAB — BASIC METABOLIC PANEL
ANION GAP: 11 (ref 5–15)
BUN: 7 mg/dL (ref 6–20)
CO2: 23 mmol/L (ref 22–32)
Calcium: 8.1 mg/dL — ABNORMAL LOW (ref 8.9–10.3)
Chloride: 99 mmol/L (ref 98–111)
Creatinine, Ser: 0.64 mg/dL (ref 0.61–1.24)
GFR calc Af Amer: >60 mL/min (ref >60)
GFR calc non Af Amer: >60 mL/min (ref >60)
Glucose, Bld: 216 mg/dL — ABNORMAL HIGH (ref 70–99)
Potassium: 3.3 mmol/L — ABNORMAL LOW (ref 3.5–5.1)
Sodium: 133 mmol/L — ABNORMAL LOW (ref 135–145)

## 2018-09-29 LAB — GLUCOSE, CAPILLARY
GLUCOSE-CAPILLARY: 235 mg/dL — AB (ref 70–99)
GLUCOSE-CAPILLARY: 233 mg/dL — AB (ref 70–99)
Glucose-Capillary: 280 mg/dL — ABNORMAL HIGH (ref 70–99)
Glucose-Capillary: 165 mg/dL — ABNORMAL HIGH (ref 70–99)

## 2018-09-29 LAB — HEMOGLOBIN A1C
Hgb A1c MFr Bld: 12.7 % — ABNORMAL HIGH (ref 4.8–5.6)
Mean Plasma Glucose: 317.79 mg/dL

## 2018-09-29 MED ORDER — INSULIN DETEMIR 100 UNIT/ML ~~LOC~~ SOLN
25.0000 [IU] | SUBCUTANEOUS | Status: DC
  Administered 2018-09-29: 25 [IU] via SUBCUTANEOUS
  Filled 2018-09-29 (×2): qty 0.25

## 2018-09-29 MED ORDER — CHLORHEXIDINE GLUCONATE 4 % EX LIQD
60.0000 mL | Freq: Once | CUTANEOUS | Status: AC
  Administered 2018-09-30: 4 via TOPICAL
  Filled 2018-09-29 (×2): qty 60

## 2018-09-29 MED ORDER — POTASSIUM CHLORIDE CRYS ER 20 MEQ PO TBCR
40.0000 meq | EXTENDED_RELEASE_TABLET | Freq: Once | ORAL | Status: AC
  Administered 2018-09-29: 40 meq via ORAL
  Filled 2018-09-29: qty 2

## 2018-09-29 NOTE — Progress Notes (Signed)
Patient ID: Ian Woodard, male   DOB: 10/25/1970, 48 y.o.   MRN: [id number]  PROGRESS NOTE    Ian Woodard  MVH:[id number] DOB: 05/25/1971 DOA: 09/27/2018 PCP: Etta GrandchildJones, Thomas L, MD   Brief Narrative:  48 year old male with history of diabetes mellitus not on any medications at home presented with right foot redness with fevers and chills.  He was admitted with DKA along with right foot cellulitis and started on insulin drip and IV antibiotics.  Assessment & Plan:   Active Problems:   Cellulitis and abscess of foot   Diabetic polyneuropathy associated with type 2 diabetes mellitus (HCC)  DKA in a patient with history of diabetes mellitus probably type II not on any medications at home -Started on insulin drip and IV fluids.  Blood sugars have much improved.  Transition to long-acting subcutaneous insulin.  Increase Levemir to 25 units daily.  Continue Accu-Cheks with coverage.  Diabetes coordinator following.  Carb modified diet.  hemoglobin A1c is 12.7.  Right foot cellulitis with concern for abscess versus osteomyelitis -X-ray was negative for osteomyelitis.  Orthopedics following.  MRI of the foot is pending.  Continue vancomycin.  Zosyn has been discontinued.  Follow cultures.  Hyponatremia -Probably from dehydration.  Sodium improving.  Monitor  Hypokalemia Replace.  Repeat a.m. labs  Leukocytosis -Probably from cellulitis.  Monitor    DVT prophylaxis: Lovenox Code Status: Full Family Communication: None at bedside Disposition Plan: Depends on clinical outcome  Consultants: Orthopedics  Procedures: None  Antimicrobials: Vancomycin  from 09/27/2018 onwards Zosyn 1 dose on 09/27/2018   Subjective: Patient seen and examined at bedside.  No overnight fever, nausea or vomiting.  Feels better but still complains of foot pain. Objective: Vitals:   09/28/18 1436 09/28/18 2154 09/29/18 0558 09/29/18 0631  BP:  104/69 (!) 88/62 110/80  Pulse:  98 100   Resp:  18 16     Temp: 99 F (37.2 C) 99.3 F (37.4 C) 98.2 F (36.8 C)   TempSrc: Oral Oral Oral   SpO2:  97% 96%   Weight:      Height:        Intake/Output Summary (Last 24 hours) at 09/29/2018 0951 Last data filed at 09/29/2018 [id number] Gross per 24 hour  Intake -  Output 2525 ml  Net -2525 ml   Filed Weights   09/27/18 1238 09/27/18 1245  Weight: 74.8 kg 74.8 kg    Examination:  General exam: Appears calm and comfortable, no distress Respiratory system: Bilateral decreased breath sounds at bases, no wheezing Cardiovascular system: S1 & S2 heard, rate controlled Gastrointestinal system: Abdomen is nondistended, soft and nontender. Normal bowel sounds heard. Extremities: No cyanosis, clubbing; right foot dressing present   Data Reviewed: I have personally reviewed following labs and imaging studies  CBC: Recent Labs  Lab 09/27/18 1320  WBC 13.6*  NEUTROABS 11.0*  HGB 14.8  HCT 43.3  MCV 84.2  PLT 330   Basic Metabolic Panel: Recent Labs  Lab 09/27/18 1320 09/28/18 0400 09/28/18 0538 09/29/18 0354  NA 128* 131* 131* 133*  K 4.0 3.6 3.5 3.3*  CL 95* 102 100 99  CO2 14* 19* 18* 23  GLUCOSE 311* 138* 189* 216*  BUN 12 8 8 7   CREATININE 1.08 0.70 0.76 0.64  CALCIUM 8.7* 8.1* 8.0* 8.1*   GFR: Estimated Creatinine Clearance: 119.5 mL/min (by C-G formula based on SCr of 0.64 mg/dL). Liver Function Tests: Recent Labs  Lab 09/27/18 1320  AST 13*  ALT  9  ALKPHOS 95  BILITOT 1.3*  PROT 7.0  ALBUMIN 3.5   No results for input(s): LIPASE, AMYLASE in the last 168 hours. No results for input(s): AMMONIA in the last 168 hours. Coagulation Profile: No results for input(s): INR, PROTIME in the last 168 hours. Cardiac Enzymes: No results for input(s): CKTOTAL, CKMB, CKMBINDEX, TROPONINI in the last 168 hours. BNP (last 3 results) No results for input(s): PROBNP in the last 8760 hours. HbA1C: Recent Labs    09/29/18 0354  HGBA1C 12.7*   CBG: Recent Labs  Lab  09/28/18 1006 09/28/18 1121 09/28/18 1234 09/28/18 2003 09/29/18 0805  GLUCAP 233* 198* 140* 198* 235*   Lipid Profile: No results for input(s): CHOL, HDL, LDLCALC, TRIG, CHOLHDL, LDLDIRECT in the last 72 hours. Thyroid Function Tests: No results for input(s): TSH, T4TOTAL, FREET4, T3FREE, THYROIDAB in the last 72 hours. Anemia Panel: No results for input(s): VITAMINB12, FOLATE, FERRITIN, TIBC, IRON, RETICCTPCT in the last 72 hours. Sepsis Labs: Recent Labs  Lab 09/27/18 1320 09/27/18 1448  LATICACIDVEN 1.3 1.7    Recent Results (from the past 240 hour(s))  Blood culture (routine x 2)     Status: None (Preliminary result)   Collection Time: 09/27/18  1:16 PM  Result Value Ref Range Status   Specimen Description BLOOD SITE NOT SPECIFIED  Final   Special Requests   Final    BOTTLES DRAWN AEROBIC AND ANAEROBIC Blood Culture adequate volume Performed at [facility], [address], [place], [place] [address]    Culture NO GROWTH 2 DAYS  Final   Report Status PENDING  Incomplete  Blood culture (routine x 2)     Status: None (Preliminary result)   Collection Time: 09/27/18 11:21 PM  Result Value Ref Range Status   Specimen Description BLOOD LEFT FOREARM  Final   Special Requests   Final    BOTTLES DRAWN AEROBIC AND ANAEROBIC Blood Culture adequate volume   Culture NO GROWTH 2 DAYS  Final   Report Status PENDING  Incomplete  MRSA PCR Screening     Status: None   Collection Time: 09/28/18  7:04 AM  Result Value Ref Range Status   MRSA by PCR NEGATIVE NEGATIVE Final    Comment:        The GeneXpert MRSA Assay (FDA approved for NASAL specimens only), is one component of a comprehensive MRSA colonization surveillance program. It is not intended to diagnose MRSA infection nor to guide or monitor treatment for MRSA infections. Performed at [facility], [address] [place], [place] [address]          Radiology Studies: Mr Foot Right W Wo  Contrast  Result Date: 09/29/2018 CLINICAL DATA:  Diabetic patient who lost the nail of his right great toe 1-2 weeks ago. Redness, pain and swelling of the great toe for 3 days. Question osteomyelitis. EXAM: MRI OF THE RIGHT FOREFOOT WITHOUT AND WITH CONTRAST TECHNIQUE: Multiplanar, multisequence MR imaging of the right forefoot was performed before and after the administration of intravenous contrast. CONTRAST:  7 cc Gadavist IV. COMPARISON:  Plain films right foot 09/27/2018. FINDINGS: Bones/Joint/Cartilage There is very mild marrow edema and postcontrast enhancement in the proximal and distal phalanges of the great toe likely secondary to reactive change from cellulitis. The patient has advanced first MTP osteoarthritis with prominent osteophytosis about the joint and some subchondral cyst formation and edema. Imaged bones otherwise appear normal. Ligaments Intact. Muscles and Tendons No intramuscular fluid collection or focal  lesion.  Intact. Soft tissues Subcutaneous edema and enhancement are seen diffusely about the foot. An ill-defined fluid collection in the medial soft tissues adjacent to the proximal phalanx of the great toe measures 1.8 cm craniocaudal by 0.8 cm transverse by 3.0 cm long and is worrisome for phlegmon or early abscess. No evidence of septic joint. IMPRESSION: Cellulitis about the foot phlegmon or early abscess in the soft tissues adjacent to the proximal phalanx of the great toe. Very mild marrow edema and enhancement in the proximal and distal phalanges of the great toes most consistent with reactive change rather than osteomyelitis. Negative for septic joint. 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 Electronically Signed   By: Drusilla Kanner M.D.   On: 09/29/2018 07:49   Dg Foot Complete Right  Result Date: 09/27/2018 CLINICAL DATA:  Great toenail ripped off 1-2 weeks ago, redness and swelling question osteomyelitis EXAM: RIGHT FOOT COMPLETE - 3+ VIEW  COMPARISON:  None. FINDINGS: Soft tissue swelling great toe. Osseous mineralization normal. Joint space narrowing at first MTP joint with spur formation. Remaining joint spaces preserved. No acute fracture, dislocation, or bone destruction. Specifically no radiographic evidence of osteomyelitis at the great toe. IMPRESSION: No acute osseous abnormalities. Degenerative changes first MTP joint. Electronically Signed   By: Ulyses Southward M.D.   On: 09/27/2018 12:03        Scheduled Meds: . enoxaparin (LOVENOX) injection  40 mg Subcutaneous Q24H  . insulin aspart  0-15 Units Subcutaneous TID WC  . insulin aspart  0-5 Units Subcutaneous QHS  . insulin detemir  25 Units Subcutaneous Q24H  . sodium chloride flush  3 mL Intravenous Once   Continuous Infusions: . sodium chloride 75 mL/hr at 09/27/18 1742  . vancomycin 1,000 mg (09/28/18 2114)     LOS: 2 days        Glade Lloyd, MD [facility] 09/29/2018, 9:51 AM

## 2018-09-29 NOTE — Progress Notes (Signed)
Inpatient Diabetes Program Recommendations  AACE/ADA: New Consensus Statement on Inpatient Glycemic Control (2015)  Target Ranges:  Prepandial:   less than 140 mg/dL      Peak postprandial:   less than 180 mg/dL (1-2 hours)      Critically ill patients:  140 - 180 mg/dL   Review of Glycemic Control  Diabetes history: DM 2 Outpatient Diabetes medications: none Current orders for Inpatient glycemic control: Levemir 25 units Q 24 hours, Novolog 0-15 units tid, Novolog 0-5 units qhs  Spoke with patient about diabetes and home regimen for diabetes control. Patient reports that he was followed by a PCP but would like to follow up with a different primary care.   Discussed current A1c level, 12.7% this admission. Discussed glucose and A1c goals. Patient to check his glucose twice a day. Discussed operation of insulin pen and the disposal of insulin pen needles. Reviewed s/s of hypoglycemia and hyperglycemia and treatment for both.  Based on patient' work schedule and dietary habits 70/30 insulin would be preferred at time of d/c. Gave patient the [employer] price list of DM supplies for patient to obtain time of discharge.  Discussed with patient about what type of insulin 70/30 is, when and how to take it.  RN CM contacted for follow up appointment for patient, which is already scheduled on March 24th at [facility]. Patient verbalized understanding of information discussed and he states that he has no further questions at this time related to diabetes.  At time of d/c would recommend patient be d/c'd on: ReliOn Novolin 70/30 20 units BID (equivalent of 28 units of basal insulin and 12 units of short acting) Patient to get glucose meter at [employer] Patient to get needles from [facility] as well  Thanks,  Christena Deem RN, MSN, BC-ADM Inpatient Diabetes Coordinator Team Pager [phone] (8a-5p)

## 2018-09-29 NOTE — Care Management Note (Addendum)
Case Management Note  Patient Details  Name: Ian Woodard MRN: [id number] Date of Birth: 07/20/1971  Subjective/Objective:      DKA , right foot cellulitis. From home with mom. PTA independent with ADL's.           [name] (Mother)     [phone]         10/02/2018  Pt s/p I& D R foot 2/29, per Dr. Lajoyce Corners. Pt will d/c with wound vac in place with the portable Praveena pump x 1 week.   Action/Plan: Pt without health insurance, works. States can afford medication @ d/c NCM to provide pt with GoodRx card to assist with medication cost. Pt has transportation to home.   Hospital f/u scheduled : [facility]     [address] [place] [address]    Next Steps: Follow up on 10/24/2018    Instructions: 2:30 pm Joaquin Courts NP       Expected Discharge Date:                  Expected Discharge Plan:  Home/Self Care  In-House Referral:  NA  Discharge planning Services  CM Consult  Post Acute Care Choice:    Choice offered to:  Patient  DME Arranged:    DME Agency:     HH Arranged:    HH Agency:     Status of Service:  In process, will continue to follow  If discussed at Long Length of Stay Meetings, dates discussed:    Additional Comments:  Epifanio Lesches, RN 09/29/2018, 3:39 PM

## 2018-09-29 NOTE — Progress Notes (Signed)
Subjective:    Patient reports pain as mild.    Objective: Vital signs in last 24 hours: Temp:  [98.2 F (36.8 C)-99.3 F (37.4 C)] 98.2 F (36.8 C) (02/28 0558) Pulse Rate:  [98-100] 100 (02/28 0558) Resp:  [16-18] 16 (02/28 0558) BP: (88-110)/(62-80) 110/80 (02/28 0631) SpO2:  [96 %-97 %] 96 % (02/28 0558)  Intake/Output from previous day: 02/27 0701 - 02/28 0700 In: -  Out: 2000 [Urine:2000] Intake/Output this shift: No intake/output data recorded.  Recent Labs    09/27/18 1320  HGB 14.8   Recent Labs    09/27/18 1320  WBC 13.6*  RBC 5.14  HCT 43.3  PLT 330   Recent Labs    09/28/18 0538 09/29/18 0354  NA 131* 133*  K 3.5 3.3*  CL 100 99  CO2 18* 23  BUN 8 7  CREATININE 0.76 0.64  GLUCOSE 189* 216*  CALCIUM 8.0* 8.1*   No results for input(s): LABPT, INR in the last 72 hours.  Right great toe lateral ulcer ~ 1 cm with decreased necrotic appearance and some pink tissue now present in the center of the ulceration .    Assessment/Plan:    Right foot cellulitis/ulceration of great toe- Improving celllulitis and no signs of osteomyelitis on MRI scan but still concern for fluid collection under the ulcerated area.  Will resume diet now.  Dr. Lajoyce Corners to re-evaluate later today as still concerns for abscess/phlegmon under the ulcerated area. Plan to drain today at bedside and if purulence present, will plan for OR tomorrow am.   Lazaro Arms, PA-C 09/29/2018, 9:10 AM  [facility] [phone]

## 2018-09-30 ENCOUNTER — Encounter (HOSPITAL_COMMUNITY): Payer: Self-pay | Admitting: Certified Registered Nurse Anesthetist

## 2018-09-30 ENCOUNTER — Encounter (HOSPITAL_COMMUNITY): Admission: EM | Disposition: A | Discharge status: Home / Self Care | Payer: Self-pay | Attending: Internal Medicine

## 2018-09-30 ENCOUNTER — Inpatient Hospital Stay (HOSPITAL_COMMUNITY): Payer: Self-pay | Admitting: Anesthesiology

## 2018-09-30 DIAGNOSIS — L03115 Cellulitis of right lower limb: Secondary | ICD-10-CM

## 2018-09-30 DIAGNOSIS — E11628 Type 2 diabetes mellitus with other skin complications: Secondary | ICD-10-CM

## 2018-09-30 DIAGNOSIS — L089 Local infection of the skin and subcutaneous tissue, unspecified: Secondary | ICD-10-CM

## 2018-09-30 HISTORY — PX: I & D EXTREMITY: SHX5045

## 2018-09-30 LAB — BASIC METABOLIC PANEL
Anion gap: 9 (ref 5–15)
BUN: 7 mg/dL (ref 6–20)
CO2: 27 mmol/L (ref 22–32)
Calcium: 8.4 mg/dL — ABNORMAL LOW (ref 8.9–10.3)
Chloride: 100 mmol/L (ref 98–111)
Creatinine, Ser: 0.63 mg/dL (ref 0.61–1.24)
Glucose, Bld: 191 mg/dL — ABNORMAL HIGH (ref 70–99)
Potassium: 3.7 mmol/L (ref 3.5–5.1)
Sodium: 136 mmol/L (ref 135–145)

## 2018-09-30 LAB — GLUCOSE, CAPILLARY
GLUCOSE-CAPILLARY: 190 mg/dL — AB (ref 70–99)
GLUCOSE-CAPILLARY: 265 mg/dL — AB (ref 70–99)
Glucose-Capillary: 211 mg/dL — ABNORMAL HIGH (ref 70–99)
Glucose-Capillary: 276 mg/dL — ABNORMAL HIGH (ref 70–99)
Glucose-Capillary: 229 mg/dL — ABNORMAL HIGH (ref 70–99)

## 2018-09-30 SURGERY — IRRIGATION AND DEBRIDEMENT EXTREMITY
Anesthesia: General | Site: Foot | Laterality: Right

## 2018-09-30 MED ORDER — MAGNESIUM CITRATE PO SOLN: 1.0000 | Freq: Once | ORAL | Status: DC | PRN

## 2018-09-30 MED ORDER — METOCLOPRAMIDE HCL 5 MG/ML IJ SOLN: 5.0000 mg | Freq: Three times a day (TID) | INTRAMUSCULAR | Status: DC | PRN

## 2018-09-30 MED ORDER — BISACODYL 10 MG RE SUPP: 10.0000 mg | Freq: Every day | RECTAL | Status: DC | PRN

## 2018-09-30 MED ORDER — HYDROMORPHONE HCL 1 MG/ML IJ SOLN: 0.5000 mg | INTRAMUSCULAR | Status: DC | PRN

## 2018-09-30 MED ORDER — ONDANSETRON HCL 4 MG PO TABS: 4.0000 mg | ORAL_TABLET | Freq: Four times a day (QID) | ORAL | Status: DC | PRN

## 2018-09-30 MED ORDER — OXYCODONE HCL 5 MG PO TABS: 5.0000 mg | ORAL_TABLET | Freq: Once | ORAL | Status: DC | PRN

## 2018-09-30 MED ORDER — INSULIN ASPART 100 UNIT/ML ~~LOC~~ SOLN
SUBCUTANEOUS | Status: AC
  Filled 2018-09-30: qty 1

## 2018-09-30 MED ORDER — OXYCODONE HCL 5 MG PO TABS: 5.0000 mg | ORAL_TABLET | ORAL | Status: DC | PRN

## 2018-09-30 MED ORDER — SODIUM CHLORIDE 0.9 % IV SOLN
INTRAVENOUS | Status: DC
  Administered 2018-09-30: 13:00:00 via INTRAVENOUS

## 2018-09-30 MED ORDER — DOCUSATE SODIUM 100 MG PO CAPS
100.0000 mg | ORAL_CAPSULE | Freq: Two times a day (BID) | ORAL | Status: DC
  Administered 2018-09-30 – 2018-10-03 (×5): 100 mg via ORAL
  Filled 2018-09-30 (×6): qty 1

## 2018-09-30 MED ORDER — LIDOCAINE 2% (20 MG/ML) 5 ML SYRINGE
INTRAMUSCULAR | Status: DC | PRN
  Administered 2018-09-30: 60 mg via INTRAVENOUS

## 2018-09-30 MED ORDER — ONDANSETRON HCL 4 MG/2ML IJ SOLN: 4.0000 mg | Freq: Four times a day (QID) | INTRAMUSCULAR | Status: DC | PRN

## 2018-09-30 MED ORDER — FENTANYL CITRATE (PF) 250 MCG/5ML IJ SOLN
INTRAMUSCULAR | Status: AC
  Filled 2018-09-30: qty 5

## 2018-09-30 MED ORDER — OXYCODONE HCL 5 MG/5ML PO SOLN: 5.0000 mg | Freq: Once | ORAL | Status: DC | PRN

## 2018-09-30 MED ORDER — METOCLOPRAMIDE HCL 10 MG PO TABS: 5.0000 mg | ORAL_TABLET | Freq: Three times a day (TID) | ORAL | Status: DC | PRN

## 2018-09-30 MED ORDER — CEFAZOLIN SODIUM-DEXTROSE 2-4 GM/100ML-% IV SOLN: 2.0000 g | INTRAVENOUS | Status: DC

## 2018-09-30 MED ORDER — ACETAMINOPHEN 325 MG PO TABS
325.0000 mg | ORAL_TABLET | Freq: Four times a day (QID) | ORAL | Status: DC | PRN
  Administered 2018-10-01 – 2018-10-02 (×2): 650 mg via ORAL
  Filled 2018-09-30 (×3): qty 2

## 2018-09-30 MED ORDER — ONDANSETRON HCL 4 MG/2ML IJ SOLN: 4.0000 mg | Freq: Once | INTRAMUSCULAR | Status: DC | PRN

## 2018-09-30 MED ORDER — METHOCARBAMOL 1000 MG/10ML IJ SOLN
500.0000 mg | Freq: Four times a day (QID) | INTRAVENOUS | Status: DC | PRN
  Filled 2018-09-30: qty 5

## 2018-09-30 MED ORDER — 0.9 % SODIUM CHLORIDE (POUR BTL) OPTIME
TOPICAL | Status: DC | PRN
  Administered 2018-09-30: 1000 mL

## 2018-09-30 MED ORDER — INSULIN DETEMIR 100 UNIT/ML ~~LOC~~ SOLN
28.0000 [IU] | SUBCUTANEOUS | Status: DC
  Filled 2018-09-30: qty 0.28

## 2018-09-30 MED ORDER — LACTATED RINGERS IV SOLN
INTRAVENOUS | Status: DC | PRN
  Administered 2018-09-30: 07:00:00 via INTRAVENOUS

## 2018-09-30 MED ORDER — FENTANYL CITRATE (PF) 100 MCG/2ML IJ SOLN: 25.0000 ug | INTRAMUSCULAR | Status: DC | PRN

## 2018-09-30 MED ORDER — PROPOFOL 500 MG/50ML IV EMUL
INTRAVENOUS | Status: DC | PRN
  Administered 2018-09-30: 75 ug/kg/min via INTRAVENOUS

## 2018-09-30 MED ORDER — PROPOFOL 10 MG/ML IV BOLUS
INTRAVENOUS | Status: DC | PRN
  Administered 2018-09-30: 40 mg via INTRAVENOUS

## 2018-09-30 MED ORDER — FENTANYL CITRATE (PF) 250 MCG/5ML IJ SOLN
INTRAMUSCULAR | Status: DC | PRN
  Administered 2018-09-30 (×2): 50 ug via INTRAVENOUS

## 2018-09-30 MED ORDER — POLYETHYLENE GLYCOL 3350 17 G PO PACK: 17.0000 g | PACK | Freq: Every day | ORAL | Status: DC | PRN

## 2018-09-30 MED ORDER — OXYCODONE HCL 5 MG PO TABS: 10.0000 mg | ORAL_TABLET | ORAL | Status: DC | PRN

## 2018-09-30 MED ORDER — INSULIN ASPART 100 UNIT/ML ~~LOC~~ SOLN
4.0000 [IU] | Freq: Three times a day (TID) | SUBCUTANEOUS | Status: DC
  Administered 2018-09-30 (×2): 4 [IU] via SUBCUTANEOUS

## 2018-09-30 MED ORDER — MIDAZOLAM HCL 2 MG/2ML IJ SOLN
INTRAMUSCULAR | Status: DC | PRN
  Administered 2018-09-30: 2 mg via INTRAVENOUS

## 2018-09-30 MED ORDER — METHOCARBAMOL 500 MG PO TABS: 500.0000 mg | ORAL_TABLET | Freq: Four times a day (QID) | ORAL | Status: DC | PRN

## 2018-09-30 MED ORDER — MIDAZOLAM HCL 2 MG/2ML IJ SOLN
INTRAMUSCULAR | Status: AC
  Filled 2018-09-30: qty 2

## 2018-09-30 MED ORDER — PROPOFOL 10 MG/ML IV BOLUS
INTRAVENOUS | Status: AC
  Filled 2018-09-30: qty 20

## 2018-09-30 SURGICAL SUPPLY — 33 items
BLADE SURG 21 STRL SS (BLADE) ×2 IMPLANT
BNDG COHESIVE 4X5 TAN STRL (GAUZE/BANDAGES/DRESSINGS) ×1 IMPLANT
BNDG COHESIVE 6X5 TAN STRL LF (GAUZE/BANDAGES/DRESSINGS) IMPLANT
BNDG GAUZE ELAST 4 BULKY (GAUZE/BANDAGES/DRESSINGS) ×4 IMPLANT
COVER SURGICAL LIGHT HANDLE (MISCELLANEOUS) ×4 IMPLANT
COVER WAND RF STERILE (DRAPES) ×2 IMPLANT
DRAPE U-SHAPE 47X51 STRL (DRAPES) ×2 IMPLANT
DRESSING PEEL AND PLC PRVNA 13 (GAUZE/BANDAGES/DRESSINGS) IMPLANT
DRSG ADAPTIC 3X8 NADH LF (GAUZE/BANDAGES/DRESSINGS) ×2 IMPLANT
DRSG PEEL AND PLACE PREVENA 13 (GAUZE/BANDAGES/DRESSINGS) ×2
DURAPREP 26ML APPLICATOR (WOUND CARE) ×2 IMPLANT
ELECT REM PT RETURN 9FT ADLT (ELECTROSURGICAL)
ELECTRODE REM PT RTRN 9FT ADLT (ELECTROSURGICAL) IMPLANT
GAUZE SPONGE 4X4 12PLY STRL (GAUZE/BANDAGES/DRESSINGS) ×2 IMPLANT
GLOVE BIOGEL PI IND STRL 9 (GLOVE) ×1 IMPLANT
GLOVE BIOGEL PI INDICATOR 9 (GLOVE) ×1
GLOVE SURG ORTHO 9.0 STRL STRW (GLOVE) ×2 IMPLANT
GOWN STRL REUS W/ TWL XL LVL3 (GOWN DISPOSABLE) ×2 IMPLANT
GOWN STRL REUS W/TWL XL LVL3 (GOWN DISPOSABLE) ×4
HANDPIECE INTERPULSE COAX TIP (DISPOSABLE)
KIT BASIN OR (CUSTOM PROCEDURE TRAY) ×2 IMPLANT
KIT TURNOVER KIT B (KITS) ×2 IMPLANT
MANIFOLD NEPTUNE II (INSTRUMENTS) ×2 IMPLANT
NS IRRIG 1000ML POUR BTL (IV SOLUTION) ×2 IMPLANT
PACK ORTHO EXTREMITY (CUSTOM PROCEDURE TRAY) ×2 IMPLANT
PAD ARMBOARD 7.5X6 YLW CONV (MISCELLANEOUS) ×4 IMPLANT
SET HNDPC FAN SPRY TIP SCT (DISPOSABLE) IMPLANT
STOCKINETTE IMPERVIOUS 9X36 MD (GAUZE/BANDAGES/DRESSINGS) IMPLANT
SWAB COLLECTION DEVICE MRSA (MISCELLANEOUS) ×2 IMPLANT
SWAB CULTURE ESWAB REG 1ML (MISCELLANEOUS) IMPLANT
TOWEL OR 17X26 10 PK STRL BLUE (TOWEL DISPOSABLE) ×2 IMPLANT
TUBE CONNECTING 12X1/4 (SUCTIONS) ×2 IMPLANT
YANKAUER SUCT BULB TIP NO VENT (SUCTIONS) ×2 IMPLANT

## 2018-09-30 NOTE — Op Note (Signed)
09/30/2018  8:48 AM  PATIENT:  Ian Woodard    PRE-OPERATIVE DIAGNOSIS:  Abscess  POST-OPERATIVE DIAGNOSIS:  Same  PROCEDURE:  IRRIGATION AND DEBRIDEMENT R foot absces Excisional debridement of skin soft tissue muscle and fascia. Local tissue rearrangement for wound closure 3 x 13 cm. Application of Praveena wound VAC.  SURGEON:  Nadara Mustard, MD  PHYSICIAN ASSISTANT:None ANESTHESIA:   General  PREOPERATIVE INDICATIONS:  [name] is a  48 y.o. male with a diagnosis of Abscess who failed conservative measures and elected for surgical management.    The risks benefits and alternatives were discussed with the patient preoperatively including but not limited to the risks of infection, bleeding, nerve injury, cardiopulmonary complications, the need for revision surgery, among others, and the patient was willing to proceed.  OPERATIVE IMPLANTS: Praveena wound VAC  @ENCIMAGES @  OPERATIVE FINDINGS: Abscess necrotic tissue and some tissue that had the appearance of tophaceous gout.  Tissue and abscess sent for cultures.  Tissue and abscess sent for crystal exam.  OPERATIVE PROCEDURE: Patient was brought the operating room after undergoing an ankle block.  After adequate levels anesthesia were obtained patient's right lower extremity was prepped using ChloraPrep and draped into a sterile field a timeout was called.  A elliptical incision was made around the necrotic tissue there was a purulent abscess with some material that the appearance of tophaceous gout.  Soft tissue was sent for cultures and crystal exam.  After excision of skin soft tissue muscle and fascia this left a wound that was 3 x 13 cm.  Both a rondure and 21 blade knife was used to excise the soft tissue.  Electrocautery was used for hemostasis the wound was then irrigated with normal saline.  The tissue margins were viable.  Local tissue rearrangement was used to close the wound 3 x 13 cm.  2-0 nylon was used.   The wound was left gaped open a little to allow for drainage.  A Praveena 13 cm incisional VAC was applied this had a good suction fit patient was taken to the PACU in stable condition.   DISCHARGE PLANNING:  Antibiotic duration: Continue IV antibiotics until cultures are finalized then would discharge on oral antibiotics for 4 weeks.  Weightbearing: Nonweightbearing on the right  Pain medication: Opioid pathway ordered  Dressing care/ Wound VAC: Continue wound VAC for 1 week at discharge  Ambulatory devices: Walker or crutches  Discharge to: Anticipate discharge to home.  Follow-up: In the office 1 week post operative.

## 2018-09-30 NOTE — Transfer of Care (Signed)
Immediate Anesthesia Transfer of Care Note  Patient: Ian Woodard  Procedure(s) Performed: IRRIGATION AND DEBRIDEMENT R foot abscess (Right Foot)  Patient Location: PACU  Anesthesia Type:MAC combined with regional for post-op pain  Level of Consciousness: awake, alert  and patient cooperative  Airway & Oxygen Therapy: Patient Spontanous Breathing  Post-op Assessment: Report given to RN and Post -op Vital signs reviewed and stable  Post vital signs: Reviewed and stable  Last Vitals:  Vitals Value Taken Time  BP 91/65 09/30/2018  8:45 AM  Temp    Pulse 91 09/30/2018  8:45 AM  Resp 18 09/30/2018  8:45 AM  SpO2 94 % 09/30/2018  8:45 AM  Vitals shown include unvalidated device data.  Last Pain:  Vitals:   09/30/18 0610  TempSrc: Oral  PainSc:       Patients Stated Pain Goal: 0 (16/10/96 0454)  Complications: No apparent anesthesia complications

## 2018-09-30 NOTE — Anesthesia Preprocedure Evaluation (Addendum)
Anesthesia Evaluation  Patient identified by MRN, date of birth, ID band Patient awake    Reviewed: Allergy & Precautions, NPO status , Patient's Chart, lab work & pertinent test results  Airway Mallampati: II  TM Distance: >3 FB Neck ROM: Full    Dental  (+) Teeth Intact, Dental Advisory Given   Pulmonary    breath sounds clear to auscultation       Cardiovascular  Rhythm:Regular Rate:Normal     Neuro/Psych    GI/Hepatic   Endo/Other  diabetes  Renal/GU      Musculoskeletal   Abdominal   Peds  Hematology   Anesthesia Other Findings   Reproductive/Obstetrics                            Anesthesia Physical Anesthesia Plan  ASA: III  Anesthesia Plan: MAC   Post-op Pain Management:    Induction: Intravenous  PONV Risk Score and Plan:   Airway Management Planned: Simple Face Mask and Natural Airway  Additional Equipment:   Intra-op Plan:   Post-operative Plan:   Informed Consent: I have reviewed the patients History and Physical, chart, labs and discussed the procedure including the risks, benefits and alternatives for the proposed anesthesia with the patient or authorized representative who has indicated his/her understanding and acceptance.     Dental advisory given  Plan Discussed with:   Anesthesia Plan Comments:        Anesthesia Quick Evaluation

## 2018-09-30 NOTE — Anesthesia Postprocedure Evaluation (Signed)
Anesthesia Post Note  Patient: Ian Woodard  Procedure(s) Performed: IRRIGATION AND DEBRIDEMENT R foot abscess (Right Foot)     Patient location during evaluation: PACU Anesthesia Type: General Level of consciousness: awake and alert Pain management: pain level controlled Vital Signs Assessment: post-procedure vital signs reviewed and stable Respiratory status: spontaneous breathing, nonlabored ventilation, respiratory function stable and patient connected to nasal cannula oxygen Cardiovascular status: stable and blood pressure returned to baseline Postop Assessment: no apparent nausea or vomiting Anesthetic complications: no    Last Vitals:  Vitals:   09/30/18 0900 09/30/18 0942  BP:  103/71  Pulse:  84  Resp:  18  Temp: 36.7 C 36.7 C  SpO2:  98%    Last Pain:  Vitals:   09/30/18 0942  TempSrc: Oral  PainSc:                  [name] COKER

## 2018-09-30 NOTE — Progress Notes (Signed)
Patient ID: Ian Woodard, male   DOB: 11-10-1970, 48 y.o.   MRN: [id number]  PROGRESS NOTE    Ian Woodard  PUL:[id number] DOB: January 24, 1971 DOA: 09/27/2018 PCP: [name], MD   Brief Narrative:  48 year old male with history of diabetes mellitus not on any medications at home presented with right foot redness with fevers and chills.  He was admitted with DKA along with right foot cellulitis and started on insulin drip and IV antibiotics.  He was transitioned to long-acting insulin after anion gap was closed.  Orthopedics was consulted.  He underwent I&D on 09/30/2018.  Assessment & Plan:   Active Problems:   Cellulitis and abscess of foot   Diabetic polyneuropathy associated with type 2 diabetes mellitus (HCC)   Diabetic foot infection (HCC)   Cellulitis of right foot  Right foot cellulitis with abscess  -X-ray was negative for osteomyelitis.  Orthopedics following.  MRI of the foot showed abscess but no evidence of osteomyelitis.  Continue vancomycin.  Zosyn has been discontinued.   -Status post I&D with wound VAC placement by orthopedics today.  Follow further recommendations from orthopedics.  Wound care as per orthopedics recommendations.  Follow OR cultures.  DKA in a patient with history of diabetes mellitus probably type II not on any medications at home -Started on insulin drip and IV fluids.  Blood sugars have much improved.  Transition to long-acting subcutaneous insulin.  - Increase Levemir to 28 units daily.  Continue Accu-Cheks with coverage.  Diabetes coordinator following.  Carb modified diet.  hemoglobin A1c is 12.7. Add NovoLog 4 units 3 times daily with meals.  Hyponatremia -Probably from dehydration.  Sodium improved.  Monitor  Hypokalemia -Improved.  Leukocytosis -Probably from cellulitis.  Monitor    DVT prophylaxis: Lovenox Code Status: Full Family Communication:  Mother at bedside Disposition Plan: Depends on clinical outcome  Consultants:  Orthopedics  Procedures: I&D of the right foot abscess with wound VAC placement on 09/30/2018 by orthopedics  Antimicrobials: Vancomycin  from 09/27/2018 onwards Zosyn 1 dose on 09/27/2018   Subjective: Patient seen and examined at bedside.  Complains of right foot pain.  No overnight fever, nausea or vomiting.   Objective: Vitals:   09/30/18 0852 09/30/18 0859 09/30/18 0900 09/30/18 0942  BP:  104/72  103/71  Pulse:  82  84  Resp:  17  18  Temp: 98.1 F (36.7 C)  98.1 F (36.7 C) 98.1 F (36.7 C)  TempSrc:    Oral  SpO2:  95%  98%  Weight:      Height:        Intake/Output Summary (Last 24 hours) at 09/30/2018 1053 Last data filed at 09/30/2018 0843 Gross per 24 hour  Intake 1140 ml  Output 2280 ml  Net -1140 ml   Filed Weights   09/27/18 1238 09/27/18 1245  Weight: 74.8 kg 74.8 kg    Examination:  General exam: No distress Respiratory system: Bilateral decreased breath sounds at bases Cardiovascular system: Rate controlled, S1-S2 heard Gastrointestinal system: Abdomen is nondistended, soft and nontender. Normal bowel sounds heard. Extremities: No cyanosis, clubbing; right foot dressing present with wound VAC  Data Reviewed: I have personally reviewed following labs and imaging studies  CBC: Recent Labs  Lab 09/27/18 1320  WBC 13.6*  NEUTROABS 11.0*  HGB 14.8  HCT 43.3  MCV 84.2  PLT 330   Basic Metabolic Panel: Recent Labs  Lab 09/27/18 1320 09/28/18 0400 09/28/18 0538 09/29/18 0354 09/30/18 0502  NA 128*  131* 131* 133* 136  K 4.0 3.6 3.5 3.3* 3.7  CL 95* 102 100 99 100  CO2 14* 19* 18* 23 27  GLUCOSE 311* 138* 189* 216* 191*  BUN CREATININE 1.08 0.70 0.76 0.64 0.63  CALCIUM 8.7* 8.1* 8.0* 8.1* 8.4*   GFR: Estimated Creatinine Clearance: 119.5 mL/min (by C-G formula based on SCr of 0.63 mg/dL). Liver Function Tests: Recent Labs  Lab 09/27/18 1320  AST 13*  ALT 9  ALKPHOS 95  BILITOT 1.3*  PROT 7.0  ALBUMIN 3.5   No  results for input(s): LIPASE, AMYLASE in the last 168 hours. No results for input(s): AMMONIA in the last 168 hours. Coagulation Profile: No results for input(s): INR, PROTIME in the last 168 hours. Cardiac Enzymes: No results for input(s): CKTOTAL, CKMB, CKMBINDEX, TROPONINI in the last 168 hours. BNP (last 3 results) No results for input(s): PROBNP in the last 8760 hours. HbA1C: Recent Labs    09/29/18 0354  HGBA1C 12.7*   CBG: Recent Labs  Lab 09/29/18 1217 09/29/18 1747 09/29/18 2116 09/30/18 0611 09/30/18 0846  GLUCAP 280* 165* 233* 190* 211*   Lipid Profile: No results for input(s): CHOL, HDL, LDLCALC, TRIG, CHOLHDL, LDLDIRECT in the last 72 hours. Thyroid Function Tests: No results for input(s): TSH, T4TOTAL, FREET4, T3FREE, THYROIDAB in the last 72 hours. Anemia Panel: No results for input(s): VITAMINB12, FOLATE, FERRITIN, TIBC, IRON, RETICCTPCT in the last 72 hours. Sepsis Labs: Recent Labs  Lab 09/27/18 1320 09/27/18 1448  LATICACIDVEN 1.3 1.7    Recent Results (from the past 240 hour(s))  Blood culture (routine x 2)     Status: None (Preliminary result)   Collection Time: 09/27/18  1:16 PM  Result Value Ref Range Status   Specimen Description BLOOD SITE NOT SPECIFIED  Final   Special Requests   Final    BOTTLES DRAWN AEROBIC AND ANAEROBIC Blood Culture adequate volume Performed at [facility], [address], [place], [place] [address]    Culture NO GROWTH 2 DAYS  Final   Report Status PENDING  Incomplete  Blood culture (routine x 2)     Status: None (Preliminary result)   Collection Time: 09/27/18 11:21 PM  Result Value Ref Range Status   Specimen Description BLOOD LEFT FOREARM  Final   Special Requests   Final    BOTTLES DRAWN AEROBIC AND ANAEROBIC Blood Culture adequate volume   Culture NO GROWTH 2 DAYS  Final   Report Status PENDING  Incomplete  MRSA PCR Screening     Status: None   Collection Time: 09/28/18  7:04 AM  Result Value Ref  Range Status   MRSA by PCR NEGATIVE NEGATIVE Final    Comment:        The GeneXpert MRSA Assay (FDA approved for NASAL specimens only), is one component of a comprehensive MRSA colonization surveillance program. It is not intended to diagnose MRSA infection nor to guide or monitor treatment for MRSA infections. Performed at [facility], [address] [place] [address], [place], [place] [address]          Radiology Studies: Mr Foot Right W Wo Contrast  Result Date: 09/29/2018 CLINICAL DATA:  Diabetic patient who lost the nail of his right great toe 1-2 weeks ago. Redness, pain and swelling of the great toe for 3 days. Question osteomyelitis. EXAM: MRI OF THE RIGHT FOREFOOT WITHOUT AND WITH CONTRAST TECHNIQUE: Multiplanar, multisequence MR imaging of the right forefoot was performed before  and after the administration of intravenous contrast. CONTRAST:  7 cc Gadavist IV. COMPARISON:  Plain films right foot 09/27/2018. FINDINGS: Bones/Joint/Cartilage There is very mild marrow edema and postcontrast enhancement in the proximal and distal phalanges of the great toe likely secondary to reactive change from cellulitis. The patient has advanced first MTP osteoarthritis with prominent osteophytosis about the joint and some subchondral cyst formation and edema. Imaged bones otherwise appear normal. Ligaments Intact. Muscles and Tendons No intramuscular fluid collection or focal lesion.  Intact. Soft tissues Subcutaneous edema and enhancement are seen diffusely about the foot. An ill-defined fluid collection in the medial soft tissues adjacent to the proximal phalanx of the great toe measures 1.8 cm craniocaudal by 0.8 cm transverse by 3.0 cm long and is worrisome for phlegmon or early abscess. No evidence of septic joint. IMPRESSION: Cellulitis about the foot phlegmon or early abscess in the soft tissues adjacent to the proximal phalanx of the great toe. Very mild marrow edema and enhancement in the proximal and  distal phalanges of the great toes most consistent with reactive change rather than osteomyelitis. Negative for septic joint. 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 Electronically Signed   By: Drusilla Kanner M.D.   On: 09/29/2018 07:49        Scheduled Meds: . enoxaparin (LOVENOX) injection  40 mg Subcutaneous Q24H  . insulin aspart      . insulin aspart  0-15 Units Subcutaneous TID WC  . insulin aspart  0-5 Units Subcutaneous QHS  . insulin detemir  25 Units Subcutaneous Q24H  . sodium chloride flush  3 mL Intravenous Once   Continuous Infusions: . sodium chloride    . vancomycin 0 mL/hr at 09/29/18 2107     LOS: 3 days        Glade Lloyd, MD [facility] 09/30/2018, 10:53 AM

## 2018-09-30 NOTE — Anesthesia Procedure Notes (Addendum)
Anesthesia Regional Block: Ankle block   Pre-Anesthetic Checklist: ,, timeout performed, Correct Patient, Correct Site, Correct Laterality, Correct Procedure, Correct Position, site marked, Risks and benefits discussed, Surgical consent,  Pre-op evaluation,  At surgeon's request  Laterality: Left  Prep: chloraprep       Needles:  Injection technique: Single-shot      Additional Needles:   Narrative:  Start time: 09/30/2018 7:15 AM End time: 09/30/2018 7:20 AM Injection made incrementally with aspirations every 5 mL.  Performed by: Personally  Anesthesiologist: Kipp Brood, MD  Additional Notes: 35 cc 2% lidocaine plain injected with 22 G needle

## 2018-09-30 NOTE — Progress Notes (Signed)
Inpatient Diabetes Program Recommendations  AACE/ADA: New Consensus Statement on Inpatient Glycemic Control  Target Ranges:  Prepandial:   less than 140 mg/dL      Peak postprandial:   less than 180 mg/dL (1-2 hours)      Critically ill patients:  140 - 180 mg/dL   Results for GIA, HEMP (MRN [id number]) as of 09/30/2018 10:11  Ref. Range 09/29/2018 08:05 09/29/2018 12:17 09/29/2018 17:47 09/29/2018 21:16 09/30/2018 06:11 09/30/2018 08:46  Glucose-Capillary Latest Ref Range: 70 - 99 mg/dL 791 (H) 505 (H) 697 (H) 233 (H) 190 (H) 211 (H)   Review of Glycemic Control  Current orders for Inpatient glycemic control: Levemir 25 units Q24H, Novolog 0-15 units TID with meals, Novolog 0-5 units QHS  Inpatient Diabetes Program Recommendations:  Insulin - Basal: Please consider increasing Levemir to 28 units Q24H.  Insulin - Meal Coverage: Post prandial glucose is consistently elevated. Please consider ordering Novolog 4 units TID with meals for meal coverage if patient eats at least 50% of meals.  Outpatient DM Plan: Recommend patient be discharged on a more affordable insulin regimen using Novolin 70/30. Based on current recommendations 70/30 20 units BID (would provide a total of 28 units for basal and 12 units for meal coverage per day).  Thanks, Orlando Penner, RN, MSN, CDE Diabetes Coordinator Inpatient Diabetes Program [phone] (Team Pager from 8am to 5pm)

## 2018-09-30 NOTE — Progress Notes (Signed)
Orthopedic Tech Progress Note Patient Details:  Ian Woodard 1970-10-09 [id number]  Ortho Devices Type of Ortho Device: Postop shoe/boot Ortho Device/Splint Location: LLE Ortho Device/Splint Interventions: Adjustment, Application, Ordered   Post Interventions Patient Tolerated: Well Instructions Provided: Care of device, Adjustment of device   Donald Pore 09/30/2018, 9:44 AM

## 2018-09-30 NOTE — Anesthesia Procedure Notes (Signed)
Procedure Name: MAC Date/Time: 09/30/2018 8:15 AM Performed by: Elayne Snare, CRNA Pre-anesthesia Checklist: Patient identified, Emergency Drugs available, Suction available and Patient being monitored Patient Re-evaluated:Patient Re-evaluated prior to induction Oxygen Delivery Method: Simple face mask

## 2018-09-30 NOTE — Interval H&P Note (Signed)
History and Physical Interval Note:  09/30/2018 7:22 AM  Ian Woodard  has presented today for surgery, with the diagnosis of Abscess  The various methods of treatment have been discussed with the patient and family. After consideration of risks, benefits and other options for treatment, the patient has consented to  Procedure(s): IRRIGATION AND DEBRIDEMENT R foot abscess (Right) as a surgical intervention .  The patient's history has been reviewed, patient examined, no change in status, stable for surgery.  I have reviewed the patient's chart and labs.  Questions were answered to the patient's satisfaction.     Lazaro Arms, PA-C [facility] [phone]

## 2018-10-01 ENCOUNTER — Encounter (HOSPITAL_COMMUNITY): Payer: Self-pay | Admitting: Orthopedic Surgery

## 2018-10-01 LAB — CBC WITH DIFFERENTIAL/PLATELET
Abs Immature Granulocytes: 0.04 10*3/uL (ref 0.00–0.07)
Basophils Absolute: 0.1 10*3/uL (ref 0.0–0.1)
Basophils Relative: 1 %
Eosinophils Absolute: 0.2 10*3/uL (ref 0.0–0.5)
Eosinophils Relative: 2 %
HEMATOCRIT: 37.8 % — AB (ref 39.0–52.0)
Hemoglobin: 12.7 g/dL — ABNORMAL LOW (ref 13.0–17.0)
Immature Granulocytes: 0 %
LYMPHS ABS: 2.1 10*3/uL (ref 0.7–4.0)
Lymphocytes Relative: 18 %
MCH: 27.9 pg (ref 26.0–34.0)
MCHC: 33.6 g/dL (ref 30.0–36.0)
MCV: 83.1 fL (ref 80.0–100.0)
Monocytes Absolute: 1.3 10*3/uL — ABNORMAL HIGH (ref 0.1–1.0)
Monocytes Relative: 11 %
NEUTROS ABS: 7.8 10*3/uL — AB (ref 1.7–7.7)
Neutrophils Relative %: 68 %
Platelets: 419 10*3/uL — ABNORMAL HIGH (ref 150–400)
RBC: 4.55 MIL/uL (ref 4.22–5.81)
RDW: 12 % (ref 11.5–15.5)
WBC: 11.6 10*3/uL — ABNORMAL HIGH (ref 4.0–10.5)
nRBC: 0 % (ref 0.0–0.2)

## 2018-10-01 LAB — BASIC METABOLIC PANEL
Anion gap: 9 (ref 5–15)
BUN: 5 mg/dL — ABNORMAL LOW (ref 6–20)
CO2: 27 mmol/L (ref 22–32)
Calcium: 8.6 mg/dL — ABNORMAL LOW (ref 8.9–10.3)
Chloride: 99 mmol/L (ref 98–111)
Creatinine, Ser: 0.77 mg/dL (ref 0.61–1.24)
GFR calc Af Amer: >60 mL/min (ref >60)
GFR calc non Af Amer: >60 mL/min (ref >60)
Glucose, Bld: 254 mg/dL — ABNORMAL HIGH (ref 70–99)
POTASSIUM: 4 mmol/L (ref 3.5–5.1)
Sodium: 135 mmol/L (ref 135–145)

## 2018-10-01 LAB — GLUCOSE, CAPILLARY
Glucose-Capillary: 227 mg/dL — ABNORMAL HIGH (ref 70–99)
Glucose-Capillary: 292 mg/dL — ABNORMAL HIGH (ref 70–99)
Glucose-Capillary: 235 mg/dL — ABNORMAL HIGH (ref 70–99)
Glucose-Capillary: 216 mg/dL — ABNORMAL HIGH (ref 70–99)
Glucose-Capillary: 156 mg/dL — ABNORMAL HIGH (ref 70–99)

## 2018-10-01 LAB — MAGNESIUM: Magnesium: 1.9 mg/dL (ref 1.7–2.4)

## 2018-10-01 MED ORDER — INSULIN ASPART 100 UNIT/ML ~~LOC~~ SOLN
6.0000 [IU] | Freq: Three times a day (TID) | SUBCUTANEOUS | Status: DC
  Administered 2018-10-01 – 2018-10-03 (×8): 6 [IU] via SUBCUTANEOUS

## 2018-10-01 MED ORDER — INSULIN DETEMIR 100 UNIT/ML ~~LOC~~ SOLN
30.0000 [IU] | SUBCUTANEOUS | Status: DC
  Administered 2018-10-01: 30 [IU] via SUBCUTANEOUS
  Filled 2018-10-01 (×2): qty 0.3

## 2018-10-01 NOTE — Progress Notes (Signed)
Pharmacy Antibiotic Note  Ian Woodard is a 48 y.o. male admitted on 09/27/2018 with cellulitis due to his ripped off nail. He has no hx of cellulitis or foot infection. Had I&D yesterday. Abx narrowed to vanc. Wound culture showing GPC, susceptibilities to follow.  Scr <1, WBC trending down, afebrile  Plan: Continue Vancomycin 1000 mg IV Q 12 hrs. Goal AUC 400-550. Expected AUC: 474 SCr used: 1.08 F/u plans PO abx  Height: 5\' 11"  (180.3 cm) Weight: 165 lb (74.8 kg) IBW/kg (Calculated) : 75.3  Temp (24hrs), Avg:99.6 F (37.6 C), Min:98.9 F (37.2 C), Max:100 F (37.8 C)  Recent Labs  Lab 09/27/18 1320 09/27/18 1448 09/28/18 0400 09/28/18 0538 09/29/18 0354 09/30/18 0502 10/01/18 0415  WBC 13.6*  --   --   --   --   --  11.6*  CREATININE 1.08  --  0.70 0.76 0.64 0.63 0.77  LATICACIDVEN 1.3 1.7  --   --   --   --   --     Estimated Creatinine Clearance: 119.5 mL/min (by C-G formula based on SCr of 0.77 mg/dL).    Allergies  Allergen Reactions  . Ibuprofen Other (See Comments)    constipation    2/26 vanc>>   Lenward Chancellor, PharmD PGY1 Pharmacy Resident Phone [phone] 10/01/2018 12:01 PM

## 2018-10-01 NOTE — Progress Notes (Signed)
Patient ID: Ian Woodard, male   DOB: Nov 04, 1970, 48 y.o.   MRN: [id number] Patient is postoperative day 1 excisional debridement of abscess Right great toe.  There was necrotic tissue there was material that appeared tophaceous and there was an abscess.  The tissue and abscess was sent for cultures.  Would recommend discharging on oral antibiotics once the sensitivities are obtained.  Will leave the wound VAC in place with the portable Praveena pump for 1 week at discharge.  Wound cultures are positive for gram-positive cocci in pairs.

## 2018-10-01 NOTE — Evaluation (Signed)
Physical Therapy Evaluation Patient Details Name: Ian Woodard MRN: [id number] DOB: [birth year] Today's Date: 10/01/2018   History of Present Illness  48 year old male with history of diabetes mellitus not on any medications at home presented with right foot redness with fevers and chills.  He was admitted with DKA along with right foot cellulitis now s/p excisional debridement of abscess Right great toe.  Clinical Impression  Orders received for PT evaluation. Patient demonstrates deficits in functional mobility as indicated below. Will benefit from continued skilled PT to address deficits and maximize function. Will see as indicated and progress as tolerated.      Follow Up Recommendations No PT follow up    Equipment Recommendations  Rolling walker with 5" wheels    Recommendations for Other Services       Precautions / Restrictions Precautions Precautions: Fall Restrictions Weight Bearing Restrictions: Yes RLE Weight Bearing: Non weight bearing      Mobility  Bed Mobility Overal bed mobility: Independent                Transfers Overall transfer level: Modified independent Equipment used: Rolling walker (2 wheeled)                Ambulation/Gait Ambulation/Gait assistance: Supervision Gait Distance (Feet): 60 Feet Assistive device: Rolling walker (2 wheeled) Gait Pattern/deviations: Step-to pattern Gait velocity: decreased   General Gait Details: steady with use of RW after cues for technique  Stairs            Wheelchair Mobility    Modified Rankin (Stroke Patients Only)       Balance Overall balance assessment: Mild deficits observed, not formally tested                                           Pertinent Vitals/Pain Pain Assessment: 0-10 Pain Score: 4  Pain Location: right foot Pain Descriptors / Indicators: Discomfort;Guarding;Sore Pain Intervention(s): Monitored during session    Home Living  Family/patient expects to be discharged to:: Private residence Living Arrangements: Parent Available Help at Discharge: Family Type of Home: House Home Access: Stairs to enter Entrance Stairs-Rails: None Secretary/administrator of Steps: 2 Home Layout: One level Home Equipment: None      Prior Function Level of Independence: Independent               Hand Dominance   Dominant Hand: Right    Extremity/Trunk Assessment   Upper Extremity Assessment Upper Extremity Assessment: Overall WFL for tasks assessed    Lower Extremity Assessment Lower Extremity Assessment: Overall WFL for tasks assessed       Communication   Communication: No difficulties  Cognition Arousal/Alertness: Awake/alert Behavior During Therapy: WFL for tasks assessed/performed Overall Cognitive Status: Within Functional Limits for tasks assessed                                        General Comments      Exercises     Assessment/Plan    PT Assessment Patient needs continued PT services  PT Problem List Decreased activity tolerance;Decreased balance;Decreased mobility;Pain       PT Treatment Interventions DME instruction;Gait training;Stair training;Functional mobility training;Therapeutic activities;Therapeutic exercise;Balance training;Patient/family education    PT Goals (Current goals can be found in the Care Plan section)  Acute  Rehab PT Goals Patient Stated Goal: to go home PT Goal Formulation: With patient Time For Goal Achievement: 10/15/18 Potential to Achieve Goals: Good    Frequency Min 3X/week   Barriers to discharge        Co-evaluation               AM-PAC PT "6 Clicks" Mobility  Outcome Measure Help needed turning from your back to your side while in a flat bed without using bedrails?: None Help needed moving from lying on your back to sitting on the side of a flat bed without using bedrails?: None Help needed moving to and from a bed to a  chair (including a wheelchair)?: None Help needed standing up from a chair using your arms (e.g., wheelchair or bedside chair)?: None Help needed to walk in hospital room?: A Little Help needed climbing 3-5 steps with a railing? : A Little 6 Click Score: 22    End of Session Equipment Utilized During Treatment: Gait belt Activity Tolerance: Patient tolerated treatment well Patient left: in chair;with call bell/phone within reach;with family/visitor present Nurse Communication: Mobility status PT Visit Diagnosis: Difficulty in walking, not elsewhere classified (R26.2);Pain Pain - Right/Left: Right Pain - part of body: Ankle and joints of foot    Time: 0912-0931 PT Time Calculation (min) (ACUTE ONLY): 19 min   Charges:   PT Evaluation $PT Eval Moderate Complexity: 1 Mod          Charlotte Crumb, PT DPT  Board Certified Neurologic Specialist Acute Rehabilitation Services Pager [phone] Office [phone]   Fabio Asa 10/01/2018, 12:16 PM

## 2018-10-01 NOTE — Progress Notes (Signed)
Patient ID: Ian Woodard, male   DOB: 1971-01-19, 48 y.o.   MRN: [id number]  PROGRESS NOTE    DELTON WINDLEY  NID:[id number] DOB: 05-Mar-1971 DOA: 09/27/2018 PCP: [name], MD   Brief Narrative:  48 year old male with history of diabetes mellitus not on any medications at home presented with right foot redness with fevers and chills.  He was admitted with DKA along with right foot cellulitis and started on insulin drip and IV antibiotics.  He was transitioned to long-acting insulin after anion gap was closed.  Orthopedics was consulted.  He underwent I&D on 09/30/2018.  Assessment & Plan:   Active Problems:   Cellulitis and abscess of foot   Diabetic polyneuropathy associated with type 2 diabetes mellitus (HCC)   Diabetic foot infection (HCC)   Cellulitis of right foot  Right foot cellulitis with abscess  -X-ray was negative for osteomyelitis.  Orthopedics following.  MRI of the foot showed abscess but no evidence of osteomyelitis.  Continue vancomycin.  Zosyn has been discontinued.   -Status post I&D with wound VAC placement by orthopedics on 09/30/18.  Follow further recommendations from orthopedics.  Wound care as per orthopedics recommendations.  Follow OR cultures.  DKA in a patient with history of diabetes mellitus probably type II not on any medications at home -Started on insulin drip and IV fluids.  Blood sugars have much improved.  Transitioned to long-acting subcutaneous insulin.  - Blood sugars still elevated - Increase Levemir to 30 units daily.  Continue Accu-Cheks with coverage.  Diabetes coordinator following.  Carb modified diet.  hemoglobin A1c is 12.7. Change NovoLog to 6 units 3 times daily with meals.  Hyponatremia -Probably from dehydration.  Sodium improved.  Monitor  Hypokalemia -Improved.  Leukocytosis -Probably from cellulitis.  Improving. Monitor    DVT prophylaxis: Lovenox Code Status: Full Family Communication:  Mother at bedside Disposition  Plan:  Home once cleared by orthopedics  Consultants: Orthopedics  Procedures: I&D of the right foot abscess with wound VAC placement on 09/30/2018 by orthopedics  Antimicrobials: Vancomycin  from 09/27/2018 onwards Zosyn 1 dose on 09/27/2018   Subjective: Patient seen and examined at bedside.  No worsening right leg pain.  No overnight fever, nausea or vomiting.   Objective: Vitals:   09/30/18 1541 09/30/18 2005 09/30/18 2324 10/01/18 0349  BP: 106/78 101/69 104/74 109/74  Pulse: 92 (!) 103 88 86  Resp: 18 18 18 18   Temp: 99.6 F (37.6 C) 99.7 F (37.6 C) 98.9 F (37.2 C) 100 F (37.8 C)  TempSrc: Oral Oral Oral Oral  SpO2: 96% 99% 93% 95%  Weight:      Height:        Intake/Output Summary (Last 24 hours) at 10/01/2018 0741 Last data filed at 10/01/2018 0631 Gross per 24 hour  Intake 1424.12 ml  Output 3431 ml  Net -2006.88 ml   Filed Weights   09/27/18 1238 09/27/18 1245  Weight: 74.8 kg 74.8 kg    Examination:  General exam: No acute distress Respiratory system: Bilateral decreased breath sounds at bases, no wheezing Cardiovascular system: Rate controlled, S1-S2 heard Gastrointestinal system: Abdomen is nondistended, soft and nontender. Normal bowel sounds heard. Extremities: No cyanosis; right foot dressing present with wound VAC  Data Reviewed: I have personally reviewed following labs and imaging studies  CBC: Recent Labs  Lab 09/27/18 1320 10/01/18 0415  WBC 13.6* 11.6*  NEUTROABS 11.0* 7.8*  HGB 14.8 12.7*  HCT 43.3 37.8*  MCV 84.2 83.1  PLT  330 419*   Basic Metabolic Panel: Recent Labs  Lab 09/28/18 0400 09/28/18 0538 09/29/18 0354 09/30/18 0502 10/01/18 0415  NA 131* 131* 133* 136 135  K 3.6 3.5 3.3* 3.7 4.0  CL 102 100 99 100 99  CO2 19* 18* 23 27 27   GLUCOSE 138* 189* 216* 191* 254*  BUN 8 8 7 7  5*  CREATININE 0.70 0.76 0.64 0.63 0.77  CALCIUM 8.1* 8.0* 8.1* 8.4* 8.6*  MG  --   --   --   --  1.9   GFR: Estimated Creatinine  Clearance: 119.5 mL/min (by C-G formula based on SCr of 0.77 mg/dL). Liver Function Tests: Recent Labs  Lab 09/27/18 1320  AST 13*  ALT 9  ALKPHOS 95  BILITOT 1.3*  PROT 7.0  ALBUMIN 3.5   No results for input(s): LIPASE, AMYLASE in the last 168 hours. No results for input(s): AMMONIA in the last 168 hours. Coagulation Profile: No results for input(s): INR, PROTIME in the last 168 hours. Cardiac Enzymes: No results for input(s): CKTOTAL, CKMB, CKMBINDEX, TROPONINI in the last 168 hours. BNP (last 3 results) No results for input(s): PROBNP in the last 8760 hours. HbA1C: Recent Labs    09/29/18 0354  HGBA1C 12.7*   CBG: Recent Labs  Lab 09/30/18 0611 09/30/18 0846 09/30/18 1157 09/30/18 1713 09/30/18 2045  GLUCAP 190* 211* 265* 276* 229*   Lipid Profile: No results for input(s): CHOL, HDL, LDLCALC, TRIG, CHOLHDL, LDLDIRECT in the last 72 hours. Thyroid Function Tests: No results for input(s): TSH, T4TOTAL, FREET4, T3FREE, THYROIDAB in the last 72 hours. Anemia Panel: No results for input(s): VITAMINB12, FOLATE, FERRITIN, TIBC, IRON, RETICCTPCT in the last 72 hours. Sepsis Labs: Recent Labs  Lab 09/27/18 1320 09/27/18 1448  LATICACIDVEN 1.3 1.7    Recent Results (from the past 240 hour(s))  Blood culture (routine x 2)     Status: None (Preliminary result)   Collection Time: 09/27/18  1:16 PM  Result Value Ref Range Status   Specimen Description BLOOD SITE NOT SPECIFIED  Final   Special Requests   Final    BOTTLES DRAWN AEROBIC AND ANAEROBIC Blood Culture adequate volume   Culture   Final    NO GROWTH 3 DAYS Performed at [facility], [address], [place], [place] [address]    Report Status PENDING  Incomplete  Blood culture (routine x 2)     Status: None (Preliminary result)   Collection Time: 09/27/18 11:21 PM  Result Value Ref Range Status   Specimen Description BLOOD LEFT FOREARM  Final   Special Requests   Final    BOTTLES DRAWN AEROBIC AND  ANAEROBIC Blood Culture adequate volume   Culture   Final    NO GROWTH 3 DAYS Performed at [facility], [address] [place], [place] [address]    Report Status PENDING  Incomplete  MRSA PCR Screening     Status: None   Collection Time: 09/28/18  7:04 AM  Result Value Ref Range Status   MRSA by PCR NEGATIVE NEGATIVE Final    Comment:        The GeneXpert MRSA Assay (FDA approved for NASAL specimens only), is one component of a comprehensive MRSA colonization surveillance program. It is not intended to diagnose MRSA infection nor to guide or monitor treatment for MRSA infections. Performed at [facility], [address], [place], [place] [address]   Aerobic/Anaerobic Culture (surgical/deep wound)     Status: None (  Preliminary result)   Collection Time: 09/30/18  8:20 AM  Result Value Ref Range Status   Specimen Description ABSCESS RIGHT FOOT GREAT TOE  Final   Special Requests A  Final   Gram Stain   Final    RARE WBC PRESENT, PREDOMINANTLY PMN FEW GRAM POSITIVE COCCI IN PAIRS IN CHAINS Performed at [facility], [address] [place], [place] [address]    Culture PENDING  Incomplete   Report Status PENDING  Incomplete         Radiology Studies: No results found.      Scheduled Meds: . docusate sodium  100 mg Oral BID  . insulin aspart  0-15 Units Subcutaneous TID WC  . insulin aspart  0-5 Units Subcutaneous QHS  . insulin aspart  6 Units Subcutaneous TID WC  . insulin detemir  30 Units Subcutaneous Q24H  . sodium chloride flush  3 mL Intravenous Once   Continuous Infusions: . sodium chloride 10 mL/hr at 09/30/18 1253  . methocarbamol (ROBAXIN) IV    . vancomycin 1,000 mg (09/30/18 6045)     LOS: 4 days        Glade Lloyd, MD [facility] 10/01/2018, 7:41 AM

## 2018-10-02 LAB — BASIC METABOLIC PANEL
ANION GAP: 10 (ref 5–15)
BUN: 7 mg/dL (ref 6–20)
CO2: 25 mmol/L (ref 22–32)
Calcium: 8.5 mg/dL — ABNORMAL LOW (ref 8.9–10.3)
Chloride: 101 mmol/L (ref 98–111)
Creatinine, Ser: 0.68 mg/dL (ref 0.61–1.24)
GFR calc Af Amer: >60 mL/min (ref >60)
GFR calc non Af Amer: >60 mL/min (ref >60)
Glucose, Bld: 177 mg/dL — ABNORMAL HIGH (ref 70–99)
Potassium: 3.7 mmol/L (ref 3.5–5.1)
Sodium: 136 mmol/L (ref 135–145)

## 2018-10-02 LAB — CULTURE, BLOOD (ROUTINE X 2)
CULTURE: NO GROWTH
Culture: NO GROWTH
Special Requests: ADEQUATE
Special Requests: ADEQUATE

## 2018-10-02 LAB — CBC WITH DIFFERENTIAL/PLATELET
Abs Immature Granulocytes: 0.09 10*3/uL — ABNORMAL HIGH (ref 0.00–0.07)
Basophils Absolute: 0.1 10*3/uL (ref 0.0–0.1)
Basophils Relative: 1 %
Eosinophils Absolute: 0.4 10*3/uL (ref 0.0–0.5)
Eosinophils Relative: 4 %
HCT: 36 % — ABNORMAL LOW (ref 39.0–52.0)
Hemoglobin: 12.5 g/dL — ABNORMAL LOW (ref 13.0–17.0)
IMMATURE GRANULOCYTES: 1 %
Lymphocytes Relative: 21 %
Lymphs Abs: 2.3 10*3/uL (ref 0.7–4.0)
MCH: 28.8 pg (ref 26.0–34.0)
MCHC: 34.7 g/dL (ref 30.0–36.0)
MCV: 82.9 fL (ref 80.0–100.0)
Monocytes Absolute: 1.3 10*3/uL — ABNORMAL HIGH (ref 0.1–1.0)
Monocytes Relative: 12 %
NEUTROS PCT: 61 %
Neutro Abs: 6.9 10*3/uL (ref 1.7–7.7)
Platelets: 484 10*3/uL — ABNORMAL HIGH (ref 150–400)
RBC: 4.34 MIL/uL (ref 4.22–5.81)
RDW: 12.1 % (ref 11.5–15.5)
WBC: 11.1 10*3/uL — ABNORMAL HIGH (ref 4.0–10.5)
nRBC: 0 % (ref 0.0–0.2)

## 2018-10-02 LAB — GLUCOSE, CAPILLARY
GLUCOSE-CAPILLARY: 227 mg/dL — AB (ref 70–99)
Glucose-Capillary: 199 mg/dL — ABNORMAL HIGH (ref 70–99)
Glucose-Capillary: 189 mg/dL — ABNORMAL HIGH (ref 70–99)
Glucose-Capillary: 197 mg/dL — ABNORMAL HIGH (ref 70–99)
Glucose-Capillary: 153 mg/dL — ABNORMAL HIGH (ref 70–99)

## 2018-10-02 LAB — MAGNESIUM: Magnesium: 2 mg/dL (ref 1.7–2.4)

## 2018-10-02 MED ORDER — CEPHALEXIN 500 MG PO CAPS
500.0000 mg | ORAL_CAPSULE | Freq: Three times a day (TID) | ORAL | Status: DC
  Administered 2018-10-02 – 2018-10-03 (×4): 500 mg via ORAL
  Filled 2018-10-02 (×4): qty 1

## 2018-10-02 MED ORDER — INSULIN DETEMIR 100 UNIT/ML ~~LOC~~ SOLN
35.0000 [IU] | SUBCUTANEOUS | Status: DC
  Administered 2018-10-02 – 2018-10-03 (×2): 35 [IU] via SUBCUTANEOUS
  Filled 2018-10-02 (×2): qty 0.35

## 2018-10-02 NOTE — Progress Notes (Signed)
Physical Therapy Treatment Patient Details Name: Ian Woodard MRN: [id number] DOB: Feb 02, 1971 Today's Date: 10/02/2018    History of Present Illness 48 year old male with history of diabetes mellitus not on any medications at home presented with right foot redness with fevers and chills.  He was admitted with DKA along with right foot cellulitis now s/p excisional debridement of abscess Right great toe.    PT Comments    Pt received in bed, willing to participate in therapy. He reports he lives part time in [place] where he has a flight of stairs to go up in order to get to the shower. Bed mobility, transfers, ambulation safe and with Mod(I) to Supervision. Attempted hopping up stairs, but pt did not feel safe so instructed him in scooting up backwards. Pt declined 3 rails up on bed. Pt asked about follow up physical therapy; education provided about outpatient PT and direct access in [place] so he is able to seek PT if he feels he needs it in the future. Pt is safe to DC home from a PT standpoint at this time.     Follow Up Recommendations  No PT follow up     Equipment Recommendations  Rolling walker with 5" wheels    Recommendations for Other Services       Precautions / Restrictions Precautions Precautions: Fall Restrictions Weight Bearing Restrictions: Yes RLE Weight Bearing: Non weight bearing    Mobility  Bed Mobility Overal bed mobility: Modified Independent                Transfers Overall transfer level: Modified independent Equipment used: Rolling walker (2 wheeled)             General transfer comment: Pt able to perform sit to stand transfers x4 without physical assistance, NWB status maintained  Ambulation/Gait Ambulation/Gait assistance: Supervision Gait Distance (Feet): 160 Feet Assistive device: Rolling walker (2 wheeled)   Gait velocity: decreased   General Gait Details: Ambulated with RW 60 ft, rest break, then 100 ft back from stairs,  supervision for safety. Pt is steady and safe with RW and able to perform backwards steps.   Stairs Stairs: Yes Stairs assistance: Min guard Stair Management: One rail Left;Sideways Number of Stairs: 3 General stair comments: Attempted lateral hopping up 3 steps, pt able to do this but uncomfortable. Instructed pt that if he does need to go up stairs at home he is moving well enough that he can scoot up backwards. Also provided demonstration on multiple options for entering home up small step (backwards or forwards with RW).   Wheelchair Mobility    Modified Rankin (Stroke Patients Only)       Balance Overall balance assessment: No apparent balance deficits (not formally assessed)                                          Cognition Arousal/Alertness: Awake/alert Behavior During Therapy: WFL for tasks assessed/performed Overall Cognitive Status: Within Functional Limits for tasks assessed                                        Exercises      General Comments General comments (skin integrity, edema, etc.): wound vac, gauze wrapped L foot      Pertinent Vitals/Pain Pain Assessment: Faces Faces  Pain Scale: Hurts a little bit Pain Location: right foot Pain Descriptors / Indicators: Discomfort;Guarding;Sore Pain Intervention(s): Repositioned;Monitored during session    Home Living                      Prior Function            PT Goals (current goals can now be found in the care plan section) Acute Rehab PT Goals Patient Stated Goal: to go home PT Goal Formulation: With patient Time For Goal Achievement: 10/15/18 Potential to Achieve Goals: Good Progress towards PT goals: Progressing toward goals    Frequency    Min 3X/week      PT Plan Current plan remains appropriate    Co-evaluation              AM-PAC PT "6 Clicks" Mobility   Outcome Measure  Help needed turning from your back to your side while in a  flat bed without using bedrails?: None Help needed moving from lying on your back to sitting on the side of a flat bed without using bedrails?: None Help needed moving to and from a bed to a chair (including a wheelchair)?: None Help needed standing up from a chair using your arms (e.g., wheelchair or bedside chair)?: None Help needed to walk in hospital room?: A Little Help needed climbing 3-5 steps with a railing? : A Little 6 Click Score: 22    End of Session Equipment Utilized During Treatment: Gait belt Activity Tolerance: Patient tolerated treatment well Patient left: in bed;with call bell/phone within reach;with nursing/sitter in room   PT Visit Diagnosis: Difficulty in walking, not elsewhere classified (R26.2);Pain Pain - Right/Left: Right Pain - part of body: Ankle and joints of foot     Time:  -     Charges:                        Halina Andreas, SPT    Halina Andreas 10/02/2018, 12:56 PM

## 2018-10-02 NOTE — Progress Notes (Signed)
Patient ID: Ian Woodard, male   DOB: June 25, 1971, 48 y.o.   MRN: [id number]  PROGRESS NOTE    JHAN WALBECK  LGX:[id number] DOB: 01-10-1971 DOA: 09/27/2018 PCP: [name], MD   Brief Narrative:  48 year old male with history of diabetes mellitus not on any medications at home presented with right foot redness with fevers and chills.  He was admitted with DKA along with right foot cellulitis and started on insulin drip and IV antibiotics.  He was transitioned to long-acting insulin after anion gap was closed.  Orthopedics was consulted.  He underwent I&D on 09/30/2018.  Assessment & Plan:   Active Problems:   Cellulitis and abscess of foot   Diabetic polyneuropathy associated with type 2 diabetes mellitus (HCC)   Diabetic foot infection (HCC)   Cellulitis of right foot  Right foot cellulitis with abscess  -X-ray was negative for osteomyelitis.  Orthopedics following.  MRI of the foot showed abscess but no evidence of osteomyelitis.   -Status post I&D with wound VAC placement by orthopedics on 09/30/18.   Wound care as per orthopedics recommendations.  OR cultures is growing group B strep.  Switch vancomycin to oral Keflex.  Dr. Lajoyce Corners recommends 4 weeks of oral antibiotics.  Patient will need portable wound VAC arranged.  Care management involved. -Had a low-grade temperature of 100 this morning, will monitor him for 1 more day prior to discharge.  DKA in a patient with history of diabetes mellitus probably type II not on any medications at home -Started on insulin drip and IV fluids.  Blood sugars have much improved.  Transitioned to long-acting subcutaneous insulin.  - Blood sugars still elevated - Increase Levemir to 35 units daily.  Continue Accu-Cheks with coverage.  Diabetes coordinator following.  Carb modified diet.  hemoglobin A1c is 12.7. Change NovoLog to 6 units 3 times daily with meals.  Hyponatremia -Probably from dehydration.  Sodium improved.   Monitor  Hypokalemia -Improved.  Leukocytosis -Probably from cellulitis.  Improving. Monitor    DVT prophylaxis: Lovenox Code Status: Full Family Communication:  Mother at bedside Disposition Plan:  Home once cleared by orthopedics  Consultants: Orthopedics  Procedures: I&D of the right foot abscess with wound VAC placement on 09/30/2018 by orthopedics  Antimicrobials: Vancomycin  from 09/27/2018 onwards Zosyn 1 dose on 09/27/2018   Subjective: Patient seen and examined at bedside.  Patient denies any worsening foot pain, nausea, vomiting.  Has intermittent headaches.  Had low-grade fever this morning.   Objective: Vitals:   10/01/18 0349 10/01/18 1447 10/01/18 2149 10/02/18 0559  BP: 109/74 106/80 109/70 (!) 92/58  Pulse: 86 84 85 88  Resp: 18  16 16   Temp: 100 F (37.8 C) 98.6 F (37 C) 99.2 F (37.3 C) 100.3 F (37.9 C)  TempSrc: Oral Oral Oral Oral  SpO2: 95% 94% 95% 96%  Weight:      Height:        Intake/Output Summary (Last 24 hours) at 10/02/2018 1051 Last data filed at 10/02/2018 0600 Gross per 24 hour  Intake 240 ml  Output 0 ml  Net 240 ml   Filed Weights   09/27/18 1238 09/27/18 1245  Weight: 74.8 kg 74.8 kg    Examination:  General exam: No distress Respiratory system: Bilateral decreased breath sounds at bases Cardiovascular system: Rate controlled, S1-S2 heard Gastrointestinal system: Abdomen is nondistended, soft and nontender. Normal bowel sounds heard. Extremities:  right foot dressing present with wound VAC  Data Reviewed: I have personally  reviewed following labs and imaging studies  CBC: Recent Labs  Lab 09/27/18 1320 10/01/18 0415 10/02/18 0427  WBC 13.6* 11.6* 11.1*  NEUTROABS 11.0* 7.8* 6.9  HGB 14.8 12.7* 12.5*  HCT 43.3 37.8* 36.0*  MCV 84.2 83.1 82.9  PLT 330 419* 484*   Basic Metabolic Panel: Recent Labs  Lab 09/28/18 0538 09/29/18 0354 09/30/18 0502 10/01/18 0415 10/02/18 0427  NA 131* 133* 136 135 136  K 3.5  3.3* 3.7 4.0 3.7  CL 100 99 100 99 101  CO2 18* 23 27 27 25   GLUCOSE 189* 216* 191* 254* 177*  BUN 8 7 7  5* 7  CREATININE 0.76 0.64 0.63 0.77 0.68  CALCIUM 8.0* 8.1* 8.4* 8.6* 8.5*  MG  --   --   --  1.9 2.0   GFR: Estimated Creatinine Clearance: 119.5 mL/min (by C-G formula based on SCr of 0.68 mg/dL). Liver Function Tests: Recent Labs  Lab 09/27/18 1320  AST 13*  ALT 9  ALKPHOS 95  BILITOT 1.3*  PROT 7.0  ALBUMIN 3.5   No results for input(s): LIPASE, AMYLASE in the last 168 hours. No results for input(s): AMMONIA in the last 168 hours. Coagulation Profile: No results for input(s): INR, PROTIME in the last 168 hours. Cardiac Enzymes: No results for input(s): CKTOTAL, CKMB, CKMBINDEX, TROPONINI in the last 168 hours. BNP (last 3 results) No results for input(s): PROBNP in the last 8760 hours. HbA1C: No results for input(s): HGBA1C in the last 72 hours. CBG: Recent Labs  Lab 10/01/18 1223 10/01/18 1612 10/01/18 1835 10/01/18 2148 10/02/18 0722  GLUCAP 292* 235* 216* 156* 199*   Lipid Profile: No results for input(s): CHOL, HDL, LDLCALC, TRIG, CHOLHDL, LDLDIRECT in the last 72 hours. Thyroid Function Tests: No results for input(s): TSH, T4TOTAL, FREET4, T3FREE, THYROIDAB in the last 72 hours. Anemia Panel: No results for input(s): VITAMINB12, FOLATE, FERRITIN, TIBC, IRON, RETICCTPCT in the last 72 hours. Sepsis Labs: Recent Labs  Lab 09/27/18 1320 09/27/18 1448  LATICACIDVEN 1.3 1.7    Recent Results (from the past 240 hour(s))  Blood culture (routine x 2)     Status: None   Collection Time: 09/27/18  1:16 PM  Result Value Ref Range Status   Specimen Description BLOOD SITE NOT SPECIFIED  Final   Special Requests   Final    BOTTLES DRAWN AEROBIC AND ANAEROBIC Blood Culture adequate volume Performed at [facility], [address], [place], [place] [address]    Culture NO GROWTH 5 DAYS  Final   Report Status 10/02/2018 FINAL  Final  Blood culture  (routine x 2)     Status: None   Collection Time: 09/27/18 11:21 PM  Result Value Ref Range Status   Specimen Description BLOOD LEFT FOREARM  Final   Special Requests   Final    BOTTLES DRAWN AEROBIC AND ANAEROBIC Blood Culture adequate volume   Culture NO GROWTH 5 DAYS  Final   Report Status 10/02/2018 FINAL  Final  MRSA PCR Screening     Status: None   Collection Time: 09/28/18  7:04 AM  Result Value Ref Range Status   MRSA by PCR NEGATIVE NEGATIVE Final    Comment:        The GeneXpert MRSA Assay (FDA approved for NASAL specimens only), is one component of a comprehensive MRSA colonization surveillance program. It is not intended to diagnose MRSA infection nor to guide or monitor treatment for MRSA infections. Performed at [facility], [address] [place], [place] [address]   Aerobic/Anaerobic Culture (surgical/deep wound)     Status: None (Preliminary result)   Collection Time: 09/30/18  8:20 AM  Result Value Ref Range Status   Specimen Description ABSCESS RIGHT FOOT GREAT TOE  Final   Special Requests A  Final   Gram Stain   Final    RARE WBC PRESENT, PREDOMINANTLY PMN FEW GRAM POSITIVE COCCI IN PAIRS IN CHAINS    Culture   Final    ABUNDANT GROUP B STREP(S.AGALACTIAE)ISOLATED TESTING AGAINST S. AGALACTIAE NOT ROUTINELY PERFORMED DUE TO PREDICTABILITY OF AMP/PEN/VAN SUSCEPTIBILITY. Performed at [facility], [address], [place], [place] [address]    Report Status PENDING  Incomplete         Radiology Studies: No results found.      Scheduled Meds: . cephALEXin  500 mg Oral Q8H  . docusate sodium  100 mg Oral BID  . insulin aspart  0-15 Units Subcutaneous TID WC  . insulin aspart  0-5 Units Subcutaneous QHS  . insulin aspart  6 Units Subcutaneous TID WC  . insulin detemir  35 Units Subcutaneous Q24H  . sodium chloride flush  3 mL Intravenous Once   Continuous Infusions: . sodium chloride 10 mL/hr at 09/30/18 1253  . methocarbamol  (ROBAXIN) IV       LOS: 5 days        Glade Lloyd, MD [facility] 10/02/2018, 10:51 AM

## 2018-10-02 NOTE — Progress Notes (Signed)
Inpatient Diabetes Program Recommendations  AACE/ADA: New Consensus Statement on Inpatient Glycemic Control (2015)  Target Ranges:  Prepandial:   less than 140 mg/dL      Peak postprandial:   less than 180 mg/dL (1-2 hours)      Critically ill patients:  140 - 180 mg/dL    Review of Glycemic Control  Patient wanted to review home regimen for insulin. Spoke with patient about simplifying insulin regimen. Ideally 70/30 insulin would benefit patient with his work schedule. Described while inpatient patient was on a SSI, meal coverage and long acting insulin. Discussed with patient when to check glucose levels at home. Patient had no other questions at this time.  Based on basal insulin dose the equivalent dose for 70/30 insulin would be 25 units BID.  Thanks,  Christena Deem RN, MSN, BC-ADM Inpatient Diabetes Coordinator Team Pager [phone] (8a-5p)

## 2018-10-03 LAB — BASIC METABOLIC PANEL
Anion gap: 8 (ref 5–15)
BUN: 9 mg/dL (ref 6–20)
CO2: 29 mmol/L (ref 22–32)
Calcium: 8.6 mg/dL — ABNORMAL LOW (ref 8.9–10.3)
Chloride: 100 mmol/L (ref 98–111)
Creatinine, Ser: 0.68 mg/dL (ref 0.61–1.24)
GFR calc Af Amer: >60 mL/min (ref >60)
GFR calc non Af Amer: >60 mL/min (ref >60)
Glucose, Bld: 125 mg/dL — ABNORMAL HIGH (ref 70–99)
Potassium: 3.7 mmol/L (ref 3.5–5.1)
Sodium: 137 mmol/L (ref 135–145)

## 2018-10-03 LAB — GLUCOSE, CAPILLARY
Glucose-Capillary: 149 mg/dL — ABNORMAL HIGH (ref 70–99)
Glucose-Capillary: 197 mg/dL — ABNORMAL HIGH (ref 70–99)

## 2018-10-03 MED ORDER — BLOOD GLUCOSE MONITOR KIT: PACK | 0 refills | Status: DC

## 2018-10-03 MED ORDER — OXYCODONE HCL 5 MG PO TABS: 5.0000 mg | ORAL_TABLET | Freq: Four times a day (QID) | ORAL | 0 refills | Status: DC | PRN

## 2018-10-03 MED ORDER — "INSULIN SYRINGE-NEEDLE U-100 31G X 1/4"" 0.5 ML MISC": 25.0000 [IU] | 0 refills | Status: DC

## 2018-10-03 MED ORDER — CEPHALEXIN 500 MG PO CAPS: 500.0000 mg | ORAL_CAPSULE | Freq: Three times a day (TID) | ORAL | 0 refills | Status: AC

## 2018-10-03 MED ORDER — INSULIN NPH ISOPHANE & REGULAR (70-30) 100 UNIT/ML ~~LOC~~ SUSP: 25.0000 [IU] | Freq: Two times a day (BID) | SUBCUTANEOUS | 1 refills | Status: DC

## 2018-10-03 MED ORDER — POLYETHYLENE GLYCOL 3350 17 G PO PACK: 17.0000 g | PACK | Freq: Every day | ORAL | 0 refills | Status: DC | PRN

## 2018-10-03 MED FILL — POLYETHYLENE GLYCOL 3350 PO: 17 | 14 days supply | Qty: 238 | Fill #0

## 2018-10-03 MED FILL — ULTICARE INS 0.5 ML 30GX1/2: 30G X 1/2" | 30 days supply | Qty: 100 | Fill #0

## 2018-10-03 MED FILL — CEPHALEXIN 500 MG CAPSULE: 500 | 27 days supply | Qty: 81 | Fill #0

## 2018-10-03 MED FILL — oxyCODONE HCL 5 MG TABS: 5 | 5 days supply | Qty: 20 | Fill #0

## 2018-10-03 MED FILL — NOVOLIN 70/30 100 UNITS/ML: (70-30) 100 | 20 days supply | Qty: 10 | Fill #0 | Status: TO

## 2018-10-03 NOTE — Progress Notes (Signed)
Received call, patient is to be discharged with [facility] ( Medication Assistance Through [facility]); a 34 day supply of medication that the patient can only use once a year. TOC pharmacy made aware. Abelino Derrick [facility] [phone]

## 2018-10-03 NOTE — Progress Notes (Signed)
Nsg Discharge Note  Admit Date:  09/27/2018 Discharge date: 10/03/2018   JAKWON GAYTON to be D/C'd Home per MD order.  AVS completed.  Copy for chart, and copy for patient signed, and dated. Patient/caregiver able to verbalize understanding.  Discharge Medication: Allergies as of 10/03/2018      Reactions   Ibuprofen Other (See Comments)   constipation      Medication List    STOP taking these medications   docusate sodium 100 MG capsule Commonly known as:  COLACE   HYDROcodone-homatropine 5-1.5 MG/5ML syrup Commonly known as:  HYCODAN   pantoprazole 40 MG tablet Commonly known as:  PROTONIX   polyethylene glycol-electrolytes 420 g solution Commonly known as:  NuLYTELY/GoLYTELY   psyllium 58.6 % packet Commonly known as:  METAMUCIL   sodium phosphate 7-19 GM/118ML Enem     TAKE these medications   blood glucose meter kit and supplies Kit Dispense based on patient and insurance preference. Use up to four times daily as directed. (FOR ICD-9 250.00, 250.01).   cephALEXin 500 MG capsule Commonly known as:  KEFLEX Take 1 capsule (500 mg total) by mouth every 8 (eight) hours for 27 days.   insulin NPH-regular Human (70-30) 100 UNIT/ML injection Commonly known as:  NOVOLIN 70/30 RELION Inject 25 Units into the skin 2 (two) times daily with a meal.   Insulin Syringe-Needle U-100 31G X 1/4" 0.5 ML Misc 25 Units by Does not apply route as directed. Novolin 70/30 25 units BID   oxyCODONE 5 MG immediate release tablet Commonly known as:  Oxy IR/ROXICODONE Take 1 tablet (5 mg total) by mouth every 6 (six) hours as needed for moderate pain.   polyethylene glycol packet Commonly known as:  MIRALAX / GLYCOLAX Take 17 g by mouth daily as needed for mild constipation.       Discharge Assessment: Vitals:   10/03/18 0451 10/03/18 1226  BP: 110/71 116/70  Pulse: 75 77  Resp: 16 20  Temp: 98 F (36.7 C) 98.5 F (36.9 C)  SpO2: 97%    Skin clean, dry and intact without  evidence of skin break down, no evidence of skin tears noted. IV catheter discontinued intact. Site without signs and symptoms of complications - no redness or edema noted at insertion site, patient denies c/o pain - only slight tenderness at site.  Dressing with slight pressure applied.  D/c Instructions-Education: Discharge instructions given to patient/family with verbalized understanding. D/c education completed with patient/family including follow up instructions, medication list, d/c activities limitations if indicated, with other d/c instructions as indicated by MD - patient able to verbalize understanding, all questions fully answered. Patient instructed to return to ED, call 911, or call MD for any changes in condition.  Patient escorted via [place], and D/C home via private auto.   Tresa Endo, RN 10/03/2018 2:05 PM

## 2018-10-03 NOTE — Discharge Summary (Signed)
Physician Discharge Summary  Ian Woodard HQI:[id number] DOB: May 17, 1971 DOA: 09/27/2018  PCP: Janith Lima, MD  Admit date: 09/27/2018 Discharge date: 10/03/2018  Admitted From: Home Disposition:  Home  Recommendations for Outpatient Follow-up:  1. Follow up with PCP in 1 week 2. Outpatient follow-up with Dr. Sharol Given.  Wound/wound [facility] care is as per Dr. Jess Barters recommendations 3. Follow up in ED if symptoms worsen or new appear   Home Health: No Equipment/Devices: Wound VAC  Discharge Condition: Stable CODE STATUS: Full Diet recommendation: Carb modified diet  Brief/Interim Summary: 48 year old male with history of diabetes mellitus not on any medications at home presented with right foot redness with fevers and chills.  He was admitted with DKA along with right foot cellulitis and started on insulin drip and IV antibiotics.  He was transitioned to long-acting insulin after anion gap was closed.  Orthopedics was consulted.  He underwent I&D on 09/30/2018.  Wound culture grew group B strep.  Antibiotics were switched to oral Keflex on 10/02/2018.  Orthopedics recommends 4 weeks of oral antibiotics and discharged with home VAC for a week at least.  He will be discharged home today.  Discharge Diagnoses:  Active Problems:   Cellulitis and abscess of foot   Diabetic polyneuropathy associated with type 2 diabetes mellitus ([place])   Diabetic foot infection ([place])   Cellulitis of right foot  Right foot cellulitis with abscess  -X-ray was negative for osteomyelitis.  Orthopedics following.  MRI of the foot showed abscess but no evidence of osteomyelitis.   -Status post I&D with wound VAC placement by orthopedics on 09/30/18.   Wound care as per orthopedics recommendations.  OR cultures is growing group B strep.  Switched vancomycin to oral Keflex on 10/02/2018.  Dr. Sharol Given recommends 4 weeks of oral antibiotics.  Patient will need portable wound VAC arranged.  Care management involved. -Outpatient  follow-up with Dr. Sharol Given within a week.  Wound/wound VAC care as per orthopedics recommendations.  DKA in a patient with history of diabetes mellitus probably type II not on any medications at home -Started on insulin drip and IV fluids.  Blood sugars have much improved.  Transitioned to long-acting subcutaneous insulin.  - hemoglobin A1c is 12.7. -Carb modified diet.  Diabetes coordinator following.  Will discharge on Novolin 70/30 25 units twice a day.  Outpatient follow-up with PCP.  Hyponatremia -Probably from dehydration.  Sodium improved.  Outpatient follow-up  Hypokalemia -Improved.  Leukocytosis -Probably from cellulitis.  Improving.   Discharge Instructions  Discharge Instructions    Call MD for:  extreme fatigue   Complete by:  As directed    Call MD for:  hives   Complete by:  As directed    Call MD for:  persistant dizziness or light-headedness   Complete by:  As directed    Call MD for:  persistant nausea and vomiting   Complete by:  As directed    Call MD for:  redness, tenderness, or signs of infection (pain, swelling, redness, odor or green/yellow discharge around incision site)   Complete by:  As directed    Call MD for:  severe uncontrolled pain   Complete by:  As directed    Call MD for:  temperature >100.4   Complete by:  As directed    Diet - low sodium heart healthy   Complete by:  As directed    Diet Carb Modified   Complete by:  As directed    Increase activity slowly  Complete by:  As directed      Allergies as of 10/03/2018      Reactions   Ibuprofen Other (See Comments)   constipation      Medication List    STOP taking these medications   docusate sodium 100 MG capsule Commonly known as:  COLACE   HYDROcodone-homatropine 5-1.5 MG/5ML syrup Commonly known as:  HYCODAN   pantoprazole 40 MG tablet Commonly known as:  PROTONIX   polyethylene glycol-electrolytes 420 g solution Commonly known as:  NuLYTELY/GoLYTELY   psyllium 58.6 %  packet Commonly known as:  METAMUCIL   sodium phosphate 7-19 GM/118ML Enem     TAKE these medications   blood glucose meter kit and supplies Kit Dispense based on patient and insurance preference. Use up to four times daily as directed. (FOR ICD-9 250.00, 250.01).   cephALEXin 500 MG capsule Commonly known as:  KEFLEX Take 1 capsule (500 mg total) by mouth every 8 (eight) hours for 27 days.   insulin NPH-regular Human (70-30) 100 UNIT/ML injection Commonly known as:  NOVOLIN 70/30 RELION Inject 25 Units into the skin 2 (two) times daily with a meal.   Insulin Syringe-Needle U-100 31G X 1/4" 0.5 ML Misc 25 Units by Does not apply route as directed. Novolin 70/30 25 units BID   oxyCODONE 5 MG immediate release tablet Commonly known as:  Oxy IR/ROXICODONE Take 1 tablet (5 mg total) by mouth every 6 (six) hours as needed for moderate pain.   polyethylene glycol packet Commonly known as:  MIRALAX / GLYCOLAX Take 17 g by mouth daily as needed for mild constipation.      Follow-up Information    [facility] Follow up on 10/24/2018.   Why:  2:30 pm Molli Barrows NP Contact information: [address] [place] [address]       Newt Minion, MD In 1 week.   Specialty:  Orthopedic Surgery Contact information: [address] [place] [address] [phone]          Allergies  Allergen Reactions  . Ibuprofen Other (See Comments)    constipation    Consultations:  Orthopedics   Procedures/Studies: Mr Foot Right W Wo Contrast  Result Date: 09/29/2018 CLINICAL DATA:  Diabetic patient who lost the nail of his right great toe 1-2 weeks ago. Redness, pain and swelling of the great toe for 3 days. Question osteomyelitis. EXAM: MRI OF THE RIGHT FOREFOOT WITHOUT AND WITH CONTRAST TECHNIQUE: Multiplanar, multisequence MR imaging of the right forefoot was performed before and after the administration of  intravenous contrast. CONTRAST:  7 cc Gadavist IV. COMPARISON:  Plain films right foot 09/27/2018. FINDINGS: Bones/Joint/Cartilage There is very mild marrow edema and postcontrast enhancement in the proximal and distal phalanges of the great toe likely secondary to reactive change from cellulitis. The patient has advanced first MTP osteoarthritis with prominent osteophytosis about the joint and some subchondral cyst formation and edema. Imaged bones otherwise appear normal. Ligaments Intact. Muscles and Tendons No intramuscular fluid collection or focal lesion.  Intact. Soft tissues Subcutaneous edema and enhancement are seen diffusely about the foot. An ill-defined fluid collection in the medial soft tissues adjacent to the proximal phalanx of the great toe measures 1.8 cm craniocaudal by 0.8 cm transverse by 3.0 cm long and is worrisome for phlegmon or early abscess. No evidence of septic joint. IMPRESSION: Cellulitis about the foot phlegmon or early abscess in the soft tissues adjacent to the proximal phalanx of  the great toe. Very mild marrow edema and enhancement in the proximal and distal phalanges of the great toes most consistent with reactive change rather than osteomyelitis. Negative for septic joint. 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 Electronically Signed   By: Inge Rise M.D.   On: 09/29/2018 07:49   Dg Foot Complete Right  Result Date: 09/27/2018 CLINICAL DATA:  Great toenail ripped off 1-2 weeks ago, redness and swelling question osteomyelitis EXAM: RIGHT FOOT COMPLETE - 3+ VIEW COMPARISON:  None. FINDINGS: Soft tissue swelling great toe. Osseous mineralization normal. Joint space narrowing at first MTP joint with spur formation. Remaining joint spaces preserved. No acute fracture, dislocation, or bone destruction. Specifically no radiographic evidence of osteomyelitis at the great toe. IMPRESSION: No acute osseous abnormalities. Degenerative changes first MTP  joint. Electronically Signed   By: Lavonia Dana M.D.   On: 09/27/2018 12:03       Subjective: Patient seen and examined at bedside.  He denies any overnight fever, nausea, vomiting or worsening foot pain.  He is okay to go home today.  Discharge Exam: Vitals:   10/02/18 2128 10/03/18 0451  BP: 109/79 110/71  Pulse: 83 75  Resp: 16 16  Temp: 99.5 F (37.5 C) 98 F (36.7 C)  SpO2: 97% 97%   General exam: No distress Respiratory system: Bilateral decreased breath sounds at bases Cardiovascular system: Rate controlled, S1-S2 heard Gastrointestinal system: Abdomen is nondistended, soft and nontender. Normal bowel sounds heard. Extremities:  right foot dressing present with wound VAC    The results of significant diagnostics from this hospitalization (including imaging, microbiology, ancillary and laboratory) are listed below for reference.     Microbiology: Recent Results (from the past 240 hour(s))  Blood culture (routine x 2)     Status: None   Collection Time: 09/27/18  1:16 PM  Result Value Ref Range Status   Specimen Description BLOOD SITE NOT SPECIFIED  Final   Special Requests   Final    BOTTLES DRAWN AEROBIC AND ANAEROBIC Blood Culture adequate volume Performed at [facility], [address], [place], [place] [address]    Culture NO GROWTH 5 DAYS  Final   Report Status 10/02/2018 FINAL  Final  Blood culture (routine x 2)     Status: None   Collection Time: 09/27/18 11:21 PM  Result Value Ref Range Status   Specimen Description BLOOD LEFT FOREARM  Final   Special Requests   Final    BOTTLES DRAWN AEROBIC AND ANAEROBIC Blood Culture adequate volume   Culture NO GROWTH 5 DAYS  Final   Report Status 10/02/2018 FINAL  Final  MRSA PCR Screening     Status: None   Collection Time: 09/28/18  7:04 AM  Result Value Ref Range Status   MRSA by PCR NEGATIVE NEGATIVE Final    Comment:        The GeneXpert MRSA Assay (FDA approved for NASAL specimens only), is one  component of a comprehensive MRSA colonization surveillance program. It is not intended to diagnose MRSA infection nor to guide or monitor treatment for MRSA infections. Performed at [facility], [place] [address] [place], [place] [address]   Aerobic/Anaerobic Culture (surgical/deep wound)     Status: None (Preliminary result)   Collection Time: 09/30/18  8:20 AM  Result Value Ref Range Status   Specimen Description ABSCESS RIGHT FOOT GREAT TOE  Final   Special Requests A  Final   Gram Stain  Final    RARE WBC PRESENT, PREDOMINANTLY PMN FEW GRAM POSITIVE COCCI IN PAIRS IN CHAINS Performed at [facility], [place] [address] [place], [place] [address]    Culture   Final    ABUNDANT GROUP B STREP(S.AGALACTIAE)ISOLATED TESTING AGAINST S. AGALACTIAE NOT ROUTINELY PERFORMED DUE TO PREDICTABILITY OF AMP/PEN/VAN SUSCEPTIBILITY. FEW STAPHYLOCOCCUS AUREUS SUSCEPTIBILITIES TO FOLLOW    Report Status PENDING  Incomplete     Labs: BNP (last 3 results) No results for input(s): BNP in the last 8760 hours. Basic Metabolic Panel: Recent Labs  Lab 09/29/18 0354 09/30/18 0502 10/01/18 0415 10/02/18 0427 10/03/18 0558  NA 133* 136 135 136 137  K 3.3* 3.7 4.0 3.7 3.7  CL 99 100 99 101 100  CO2 '23 27 27 25 29  ' GLUCOSE 216* 191* 254* 177* 125*  BUN 7 7 5* 7 9  CREATININE 0.64 0.63 0.77 0.68 0.68  CALCIUM 8.1* 8.4* 8.6* 8.5* 8.6*  MG  --   --  1.9 2.0  --    Liver Function Tests: Recent Labs  Lab 09/27/18 1320  AST 13*  ALT 9  ALKPHOS 95  BILITOT 1.3*  PROT 7.0  ALBUMIN 3.5   No results for input(s): LIPASE, AMYLASE in the last 168 hours. No results for input(s): AMMONIA in the last 168 hours. CBC: Recent Labs  Lab 09/27/18 1320 10/01/18 0415 10/02/18 0427  WBC 13.6* 11.6* 11.1*  NEUTROABS 11.0* 7.8* 6.9  HGB 14.8 12.7* 12.5*  HCT 43.3 37.8* 36.0*  MCV 84.2 83.1 82.9  PLT 330 419* 484*   Cardiac Enzymes: No results for input(s): CKTOTAL, CKMB, CKMBINDEX,  TROPONINI in the last 168 hours. BNP: Invalid input(s): POCBNP CBG: Recent Labs  Lab 10/02/18 1216 10/02/18 1633 10/02/18 1836 10/02/18 2043 10/03/18 0710  GLUCAP 227* 189* 197* 153* 149*   D-Dimer No results for input(s): DDIMER in the last 72 hours. Hgb A1c No results for input(s): HGBA1C in the last 72 hours. Lipid Profile No results for input(s): CHOL, HDL, LDLCALC, TRIG, CHOLHDL, LDLDIRECT in the last 72 hours. Thyroid function studies No results for input(s): TSH, T4TOTAL, T3FREE, THYROIDAB in the last 72 hours.  Invalid input(s): FREET3 Anemia work up No results for input(s): VITAMINB12, FOLATE, FERRITIN, TIBC, IRON, RETICCTPCT in the last 72 hours. Urinalysis No results found for: COLORURINE, APPEARANCEUR, [place], [place], [place], [place], [place], [place], PROTEINUR, UROBILINOGEN, NITRITE, LEUKOCYTESUR Sepsis Labs Invalid input(s): PROCALCITONIN,  WBC,  LACTICIDVEN Microbiology Recent Results (from the past 240 hour(s))  Blood culture (routine x 2)     Status: None   Collection Time: 09/27/18  1:16 PM  Result Value Ref Range Status   Specimen Description BLOOD SITE NOT SPECIFIED  Final   Special Requests   Final    BOTTLES DRAWN AEROBIC AND ANAEROBIC Blood Culture adequate volume Performed at [facility], [address], [place], [place] [address]    Culture NO GROWTH 5 DAYS  Final   Report Status 10/02/2018 FINAL  Final  Blood culture (routine x 2)     Status: None   Collection Time: 09/27/18 11:21 PM  Result Value Ref Range Status   Specimen Description BLOOD LEFT FOREARM  Final   Special Requests   Final    BOTTLES DRAWN AEROBIC AND ANAEROBIC Blood Culture adequate volume   Culture NO GROWTH 5 DAYS  Final   Report Status 10/02/2018 FINAL  Final  MRSA PCR Screening     Status: None   Collection Time: 09/28/18  7:04  AM  Result Value Ref Range Status   MRSA by PCR NEGATIVE NEGATIVE Final    Comment:        The GeneXpert MRSA Assay  (FDA approved for NASAL specimens only), is one component of a comprehensive MRSA colonization surveillance program. It is not intended to diagnose MRSA infection nor to guide or monitor treatment for MRSA infections. Performed at [facility], [place] [address], [place], [place] [address]   Aerobic/Anaerobic Culture (surgical/deep wound)     Status: None (Preliminary result)   Collection Time: 09/30/18  8:20 AM  Result Value Ref Range Status   Specimen Description ABSCESS RIGHT FOOT GREAT TOE  Final   Special Requests A  Final   Gram Stain   Final    RARE WBC PRESENT, PREDOMINANTLY PMN FEW GRAM POSITIVE COCCI IN PAIRS IN CHAINS Performed at [facility], [place] [address], [place], [place] [address]    Culture   Final    ABUNDANT GROUP B STREP(S.AGALACTIAE)ISOLATED TESTING AGAINST S. AGALACTIAE NOT ROUTINELY PERFORMED DUE TO PREDICTABILITY OF AMP/PEN/VAN SUSCEPTIBILITY. FEW STAPHYLOCOCCUS AUREUS SUSCEPTIBILITIES TO FOLLOW    Report Status PENDING  Incomplete     Time coordinating discharge: 35 minutes  SIGNED:   Aline August, MD  [facility] 10/03/2018, 10:06 AM

## 2018-10-04 ENCOUNTER — Telehealth (INDEPENDENT_AMBULATORY_CARE_PROVIDER_SITE_OTHER): Payer: Self-pay | Admitting: Orthopedic Surgery

## 2018-10-04 NOTE — Telephone Encounter (Signed)
Cory from [facility] called advised patient is wearing a Prevena wound vac and want to know does the patient need nursing Home Health. The number to contact Kandee Keen is [phone]

## 2018-10-04 NOTE — Telephone Encounter (Signed)
I called and lm on vm to advise that the pt should leave the vac in place until his first post op appt on Monday 10/09/2018. Make sure that the vac is charged and suctioning and not alarming but will not need home health nursing visit until after removal next week. To call with any questions.

## 2018-10-05 LAB — AEROBIC/ANAEROBIC CULTURE W GRAM STAIN (SURGICAL/DEEP WOUND)

## 2018-10-05 LAB — AEROBIC/ANAEROBIC CULTURE (SURGICAL/DEEP WOUND)

## 2018-10-09 ENCOUNTER — Encounter (INDEPENDENT_AMBULATORY_CARE_PROVIDER_SITE_OTHER): Payer: Self-pay | Admitting: Orthopedic Surgery

## 2018-10-09 ENCOUNTER — Ambulatory Visit (INDEPENDENT_AMBULATORY_CARE_PROVIDER_SITE_OTHER): Payer: Self-pay | Admitting: Physician Assistant

## 2018-10-09 VITALS — Ht 71.0 in | Wt 165.0 lb

## 2018-10-09 DIAGNOSIS — L97512 Non-pressure chronic ulcer of other part of right foot with fat layer exposed: Secondary | ICD-10-CM

## 2018-10-09 DIAGNOSIS — E1142 Type 2 diabetes mellitus with diabetic polyneuropathy: Secondary | ICD-10-CM

## 2018-10-09 DIAGNOSIS — M1A0710 Idiopathic chronic gout, right ankle and foot, without tophus (tophi): Secondary | ICD-10-CM

## 2018-10-09 NOTE — Progress Notes (Signed)
Office Visit Note   Patient: Ian Woodard           Date of Birth: 1970-08-27           MRN: [id number] Visit Date: 10/09/2018              Requested by: [name], MD [address] [place], [place] [address] PCP: [name], MD  Chief Complaint  Patient presents with  . Right Foot - Routine Post Op    09/30/2018 I&D; wound vac removed      HPI: The patient is a 48 year old gentleman with a history of type 2 diabetes mellitus with insensate neuropathy who was admitted for an abscess over his right great toe.  He was in DKA at the time.  He underwent irrigation and debridement of the right great toe on 09/30/2018 in necrotic abscess tissue and some tissue that had the appearance of a tophaceous gout material was sent for culture.  The cultures grew group B strep only.  He is continued on oral Keflex and will continue on antibiotics for another 4 weeks.  He reports that he has been offloading the area ambulating with a walker.  He has had the [facility] on for the past week.  He does report that his blood sugars are still not under tight control and have been running in the 200s to 300s.  His blood sugar this morning was 169.  He is going to see a new primary care physician and we discussed that he does need to talk with them about his blood sugars today as he may need further adjustment of his insulin.  Assessment & Plan: Visit Diagnoses:  1. Ulcer of toe of right foot, with fat layer exposed (HCC)   2. Type 2 diabetes mellitus with diabetic polyneuropathy, without long-term current use of insulin (HCC)   3. Chronic idiopathic gout involving toe of right foot without tophus     Plan: The [place] VAC was removed.  The patient was instructed to wash the foot with Dial soap and water daily and apply dry dressings between the macerated toes and over the incisional area.  He should continue his Keflex as prescribed.  We drew a uric acid level today as well.  He is  currently on no medications for gout.   Follow-up with the PCP concerning insulin dosing as blood sugars are still not under good control.  We also discussed probiotic, vitamins including extra vitamin C and zinc for wound healing, and whey protein supplement with low carbohydrate content to help heal.  He will continue to offload and utilize walker or knee scooter for ambulation.  He will follow-up in 1 week.  Follow-Up Instructions: Return in about 1 week (around 10/16/2018).   Ortho Exam  Patient is alert, oriented, no adenopathy, well-dressed, normal affect, normal respiratory effort. The incisional site is widening and the sutures are under tension.  There is paleness of the peri-incisional area and underlying subcutaneous tissue.  There is no odor or drainage.  The macerated peri-incisional tissue was debrided with gauze.  He has a palpable pedal pulse.  There is minimal erythema of the right great toe and moderate edema.    Imaging: No results found. No images are attached to the encounter.  Labs: Lab Results  Component Value Date   HGBA1C 12.7 (H) 09/29/2018   LABURIC 2.7 (L) 09/28/2018   REPTSTATUS 10/05/2018 FINAL 09/30/2018   GRAMSTAIN  09/30/2018  RARE WBC PRESENT, PREDOMINANTLY PMN FEW GRAM POSITIVE COCCI IN PAIRS IN CHAINS    CULT  09/30/2018    ABUNDANT STREPTOCOCCUS AGALACTIAE TESTING AGAINST S. AGALACTIAE NOT ROUTINELY PERFORMED DUE TO PREDICTABILITY OF AMP/PEN/VAN SUSCEPTIBILITY. FEW STAPHYLOCOCCUS AUREUS NO ANAEROBES ISOLATED Performed at [facility], [address] [place], [place] [address]    [facility] STAPHYLOCOCCUS AUREUS 09/30/2018     Lab Results  Component Value Date   ALBUMIN 3.5 09/27/2018   LABURIC 2.7 (L) 09/28/2018    Body mass index is 23.01 kg/m.  Orders:  Orders Placed This Encounter  Procedures  . Uric acid   No orders of the defined types were placed in this encounter.    Procedures: No procedures performed  Clinical  Data: No additional findings.  ROS:  All other systems negative, except as noted in the HPI. Review of Systems  Objective: Vital Signs: Ht 5\' 11"  (1.803 m)   Wt 165 lb (74.8 kg)   BMI 23.01 kg/m   Specialty Comments:  No specialty comments available.  PMFS History: Patient Active Problem List   Diagnosis Date Noted  . Diabetic foot infection (HCC)   . Cellulitis of right foot   . Diabetic polyneuropathy associated with type 2 diabetes mellitus (HCC)   . Cellulitis and abscess of foot 09/27/2018  . Abscess of lower back 08/15/2015  . Cough 03/18/2015  . CAP (community acquired pneumonia) 03/18/2015  . Dysphagia, pharyngoesophageal phase 03/18/2015  . 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 Diabetes mellitus (HCC)     Family History  Problem Relation Age of Onset  . Hypertension Mother   . Hypertension Father   . Aneurysm Father   . Breast cancer Maternal Grandmother   . Alcohol abuse Neg Hx   . Cancer Neg Hx   . COPD Neg Hx   . Diabetes Neg Hx   . Early death Neg Hx   . Drug abuse Neg Hx   . Hearing loss Neg Hx   . Heart disease Neg Hx   . Hyperlipidemia Neg Hx   . Kidney disease Neg Hx   . Stroke Neg Hx     Past Surgical History:  Procedure Laterality Date  . HERNIA REPAIR  1995  . I&D EXTREMITY Right 09/30/2018   Procedure: IRRIGATION AND DEBRIDEMENT R foot abscess;  Surgeon: Nadara Mustard, MD;  Location[facility] OR;  Service: Orthopedics;  Laterality: Right;  . TONSILLECTOMY  2004   Social History   Occupational History  . Occupation: Chef  Tobacco Use  . Smoking status: Never Smoker  . Smokeless tobacco: Never Used  Substance and Sexual Activity  . Alcohol use: Yes    Alcohol/week: 1.0 standard drinks    Types: 1 Glasses of wine per week  . Drug use: No  . Sexual activity: Not Currently

## 2018-10-10 ENCOUNTER — Ambulatory Visit: Payer: Self-pay | Attending: Family Medicine | Admitting: Nurse Practitioner

## 2018-10-10 ENCOUNTER — Encounter: Payer: Self-pay | Admitting: Nurse Practitioner

## 2018-10-10 VITALS — BP 113/74 | HR 81 | Temp 98.7°F | Ht 71.0 in | Wt 165.0 lb

## 2018-10-10 DIAGNOSIS — K5909 Other constipation: Secondary | ICD-10-CM

## 2018-10-10 DIAGNOSIS — E1165 Type 2 diabetes mellitus with hyperglycemia: Secondary | ICD-10-CM

## 2018-10-10 DIAGNOSIS — E1142 Type 2 diabetes mellitus with diabetic polyneuropathy: Secondary | ICD-10-CM

## 2018-10-10 DIAGNOSIS — E118 Type 2 diabetes mellitus with unspecified complications: Secondary | ICD-10-CM

## 2018-10-10 LAB — URIC ACID: Uric Acid, Serum: 4.4 mg/dL (ref 4.0–8.0)

## 2018-10-10 LAB — GLUCOSE, POCT (MANUAL RESULT ENTRY): POC Glucose: 98 mg/dl (ref 70–99)

## 2018-10-10 MED ORDER — "INSULIN SYRINGE-NEEDLE U-100 31G X 1/4"" 0.5 ML MISC": 25.0000 [IU] | 0 refills | Status: DC

## 2018-10-10 MED ORDER — INSULIN NPH ISOPHANE & REGULAR (70-30) 100 UNIT/ML ~~LOC~~ SUSP: 25.0000 [IU] | Freq: Two times a day (BID) | SUBCUTANEOUS | 1 refills | Status: DC

## 2018-10-10 MED ORDER — DOCUSATE SODIUM 100 MG PO CAPS: 100.0000 mg | ORAL_CAPSULE | Freq: Two times a day (BID) | ORAL | 0 refills | Status: AC

## 2018-10-10 MED ORDER — METFORMIN HCL 1000 MG PO TABS: 1000.0000 mg | ORAL_TABLET | Freq: Two times a day (BID) | ORAL | 3 refills | Status: DC

## 2018-10-10 MED FILL — metFORMIN HCL 1000 MG TABS: 1000 | 30 days supply | Qty: 60 | Fill #0

## 2018-10-10 NOTE — Patient Instructions (Addendum)
Diabetes blood sugar goals  Fasting in AM before breakfast which means at least 8 hrs of no eating or drinking) except water or unsweetened coffee or tea): 90-130 2 hrs after meals: < 180,   Hypoglycemia or low blood sugar: < 70 (You should not have hypoglycemia.)  Aim for 30 minutes of exercise most days. Rethink what you drink. Water is great! Aim for 2-3 Carb Choices per meal (30-40 grams) +/- 1 either way  Aim for 0-15 Carbs per snack if hungry  Include protein in moderation with your meals and snacks  Consider reading food labels for Total Carbohydrate and Fat Grams of foods  Consider checking BG at alternate times per day  Continue taking medication as directed Be mindful about how much sugar you are adding to beverages and other foods. Fruit Punch - find one with no sugar  Measure and decrease portions of carbohydrate foods  Make your plate and don't go back for seconds 

## 2018-10-10 NOTE — Progress Notes (Signed)
Assessment & Plan:  Ian Woodard was seen today for hospitalization follow-up.  Diagnoses and all orders for this visit:  Diabetic polyneuropathy associated with type 2 diabetes mellitus (HCC) -     Glucose (CBG) -     metFORMIN (GLUCOPHAGE) 1000 MG tablet; Take 1 tablet (1,000 mg total) by mouth 2 (two) times daily with a meal. -     Insulin Syringe-Needle U-100 31G X 1/4" 0.5 ML MISC; 25 Units by Does not apply route as directed. Novolin 70/30 25 units BID -     insulin NPH-regular Human (NOVOLIN 70/30 RELION) (70-30) 100 UNIT/ML injection; Inject 25 Units into the skin 2 (two) times daily with a meal. Continue blood sugar control as discussed in office today, low carbohydrate diet, and regular physical exercise as tolerated, 150 minutes per week (30 min each day, 5 days per week, or 50 min 3 days per week). Keep blood sugar logs with fasting goal of 90-130 mg/dl, post prandial (after you eat) less than 180.  For Hypoglycemia: BS <60 and Hyperglycemia BS >400; contact the clinic ASAP. Annual eye exams and foot exams are recommended.  Poorly controlled type 2 diabetes mellitus with complication (HCC) Diabetes is poorly controlled. Advised patient to keep a fasting blood sugar log fast, 2 hours post lunch and bedtime which will be reviewed at the next office visit.  Chronic constipation -     docusate sodium (COLACE) 100 MG capsule; Take 1 capsule (100 mg total) by mouth 2 (two) times daily for 7 days. Increase water intake, green leafy vegetables, fiber  Patient has been counseled on age-appropriate routine health concerns for screening and prevention. These are reviewed and up-to-date. Referrals have been placed accordingly. Immunizations are up-to-date or declined.    Subjective:   Chief Complaint  Patient presents with  . Hospitalization Follow-up    Pt. stated is here for HFU. Pt. stated he have not have a bowel movement for a few days, and no pain.    [name] 48 y.o.  male presents to office today for HFU. Diagnosed 2000 with DM TYPE 2. States he will be establishing care at [facility] as it is closer to his home.    HOSPITAL FOLLOW UP Admitted 09-27-2018 with diabetic foot infection and DKA requiring IV insulin, IV abx and I&D of right foot. Discharged home on Oral keflex x 4 weeks, wound vac and Novolin 70/30 25 units BID. A1c at that time was 12.7. Today he endorses average home blood glucose readings 200-300s. I will start him on metformin 1000 mg BID. He denies any hypoglycemic symptoms. Ortho is following him for his diabetic foot infection with most recent appointment just yesterday.  Lab Results  Component Value Date   HGBA1C 12.7 (H) 09/29/2018    Constipation Onset last week after being discharged from hospital. He was also taking oxy at that time for pain. States he had a complete blockage last year after taking ibuprofen and is worried his current symptoms may progress into the same. No bowel movement for 3 days.   Review of Systems  Constitutional: Negative for fever, malaise/fatigue and weight loss.  HENT: Negative.  Negative for nosebleeds.   Eyes: Negative.  Negative for blurred vision, double vision and photophobia.  Respiratory: Negative.  Negative for cough and shortness of breath.   Cardiovascular: Negative.  Negative for chest pain, palpitations and leg swelling.  Gastrointestinal: Positive for constipation. Negative for abdominal pain, blood in stool, diarrhea, heartburn, melena, nausea and  vomiting.  Musculoskeletal: Negative for myalgias.       SEE HPI  Neurological: Negative.  Negative for dizziness, focal weakness, seizures and headaches.  Psychiatric/Behavioral: Negative.  Negative for suicidal ideas.    Past Medical History:  Diagnosis Date  . Diabetes mellitus ([place])     Past Surgical History:  Procedure Laterality Date  . HERNIA REPAIR  1995  . I&D EXTREMITY Right 09/30/2018   Procedure: IRRIGATION AND DEBRIDEMENT  R foot abscess;  Surgeon: Newt Minion, MD;  Location: [place];  Service: Orthopedics;  Laterality: Right;  . TONSILLECTOMY  2004    Family History  Problem Relation Age of Onset  . Hypertension Mother   . Hypertension Father   . Aneurysm Father   . Breast cancer Maternal Grandmother   . Alcohol abuse Neg Hx   . Cancer Neg Hx   . COPD Neg Hx   . Diabetes Neg Hx   . Early death Neg Hx   . Drug abuse Neg Hx   . Hearing loss Neg Hx   . Heart disease Neg Hx   . Hyperlipidemia Neg Hx   . Kidney disease Neg Hx   . Stroke Neg Hx     Social History Reviewed with no changes to be made today.   Outpatient Medications Prior to Visit  Medication Sig Dispense Refill  . blood glucose meter kit and supplies KIT Dispense based on patient and insurance preference. Use up to four times daily as directed. (FOR ICD-9 250.00, 250.01). 1 each 0  . cephALEXin (KEFLEX) 500 MG capsule Take 1 capsule (500 mg total) by mouth every 8 (eight) hours for 27 days. 81 capsule 0  . insulin NPH-regular Human (NOVOLIN 70/30 RELION) (70-30) 100 UNIT/ML injection Inject 25 Units into the skin 2 (two) times daily with a meal. 10 mL 1  . Insulin Syringe-Needle U-100 31G X 1/4" 0.5 ML MISC 25 Units by Does not apply route as directed. Novolin 70/30 25 units BID 100 each 0  . polyethylene glycol (MIRALAX / GLYCOLAX) packet Take 17 g by mouth daily as needed for mild constipation. 10 each 0  . oxyCODONE (OXY IR/ROXICODONE) 5 MG immediate release tablet Take 1 tablet (5 mg total) by mouth every 6 (six) hours as needed for moderate pain. (Patient not taking: Reported on 10/10/2018) 20 tablet 0   No facility-administered medications prior to visit.     Allergies  Allergen Reactions  . Ibuprofen Other (See Comments)    constipation       Objective:    BP 113/74 (BP Location: Right Arm, Patient Position: Sitting, Cuff Size: Normal)   Pulse 81   Temp 98.7 F (37.1 C) (Oral)   Ht _0  (1.803 m)   Wt 165 lb (74.8  kg)   SpO2 99%   BMI 23.01 kg/m  Wt Readings from Last 3 Encounters:  10/10/18 165 lb (74.8 kg)  10/09/18 165 lb (74.8 kg)  09/27/18 165 lb (74.8 kg)    Physical Exam Vitals signs and nursing note reviewed.  Constitutional:      Appearance: He is well-developed.  HENT:     Head: Normocephalic and atraumatic.  Neck:     Musculoskeletal: Normal range of motion.  Cardiovascular:     Rate and Rhythm: Normal rate and regular rhythm.     Heart sounds: Normal heart sounds. No murmur. No friction rub. No gallop.   Pulmonary:     Effort: Pulmonary effort is normal. No tachypnea or respiratory  distress.     Breath sounds: Normal breath sounds. No decreased breath sounds, wheezing, rhonchi or rales.  Chest:     Chest wall: No tenderness.  Abdominal:     General: Bowel sounds are normal. There is distension.     Palpations: Abdomen is soft.     Tenderness: There is no abdominal tenderness.  Musculoskeletal: Normal range of motion.  Skin:    General: Skin is warm and dry.  Neurological:     Mental Status: He is alert and oriented to person, place, and time.     Coordination: Coordination normal.  Psychiatric:        Behavior: Behavior normal. Behavior is cooperative.        Thought Content: Thought content normal.        Judgment: Judgment normal.        Patient has been counseled extensively about nutrition and exercise as well as the importance of adherence with medications and regular follow-up. The patient was given clear instructions to go to ER or return to medical center if symptoms don't improve, worsen or new problems develop. The patient verbalized understanding.   Follow-up: No follow-ups on file.   Gildardo Pounds, FNP-BC [facility] [place], [place]   10/12/2018, 6:28 PM

## 2018-10-10 NOTE — Progress Notes (Signed)
Called pt to advise of lab results has an appt on Monday and will call with any questions.

## 2018-10-12 ENCOUNTER — Encounter: Payer: Self-pay | Admitting: Nurse Practitioner

## 2018-10-13 ENCOUNTER — Inpatient Hospital Stay: Payer: Self-pay | Admitting: Family Medicine

## 2018-10-16 ENCOUNTER — Other Ambulatory Visit: Payer: Self-pay

## 2018-10-16 ENCOUNTER — Encounter (INDEPENDENT_AMBULATORY_CARE_PROVIDER_SITE_OTHER): Payer: Self-pay | Admitting: Orthopedic Surgery

## 2018-10-16 ENCOUNTER — Ambulatory Visit (INDEPENDENT_AMBULATORY_CARE_PROVIDER_SITE_OTHER): Payer: Self-pay | Admitting: Orthopedic Surgery

## 2018-10-16 VITALS — Ht 71.0 in | Wt 165.0 lb

## 2018-10-16 DIAGNOSIS — L97512 Non-pressure chronic ulcer of other part of right foot with fat layer exposed: Secondary | ICD-10-CM

## 2018-10-16 MED FILL — !NOVOLIN 70/30 100 UNITS/ML: (70-30) 100 | 20 days supply | Qty: 10 | Fill #0

## 2018-10-16 NOTE — Progress Notes (Signed)
Office Visit Note   Patient: Ian Woodard           Date of Birth: Feb 09, 1971           MRN: [id number] Visit Date: 10/16/2018              Requested by: [name], MD [address] [place], [place] [address] PCP: [name], MD  Chief Complaint  Patient presents with  . Right Foot - Routine Post Op    09/30/2018 I&D right GT      HPI: Patient is a 48 year old gentleman who presents the status post irrigation debridement of the right great toe he is currently nonweightbearing on a kneeling scooter he forgot to wear his postoperative shoe.  His uric acid was 2.7.  These sutures are intact.  Assessment & Plan: Visit Diagnoses:  1. Ulcer of toe of right foot, with fat layer exposed (HCC)     Plan: Patient is shown improvement in decreasing swelling recommend continue with elevation recommended washing with soap and water antibiotic ointment dry dressing change daily.  Remove the sutures at follow-up.  Follow-Up Instructions: Return in about 1 week (around 10/23/2018).   Ortho Exam  Patient is alert, oriented, no adenopathy, well-dressed, normal affect, normal respiratory effort. Examination patient has dry flaky skin from the decreased swelling.  He is finishing up his Keflex.  The wound has approximately 90% healthy granulation tissue the wound is gaped open about 3 mm and there is a small eschar distally which appears superficial.  There is no cellulitis no drainage no signs of infection the wound has no depth.  Imaging: No results found. No images are attached to the encounter.  Labs: Lab Results  Component Value Date   HGBA1C 12.7 (H) 09/29/2018   LABURIC 4.4 10/09/2018   LABURIC 2.7 (L) 09/28/2018   REPTSTATUS 10/05/2018 FINAL 09/30/2018   GRAMSTAIN  09/30/2018    RARE WBC PRESENT, PREDOMINANTLY PMN FEW GRAM POSITIVE COCCI IN PAIRS IN CHAINS    CULT  09/30/2018    ABUNDANT STREPTOCOCCUS AGALACTIAE TESTING AGAINST S. AGALACTIAE NOT  ROUTINELY PERFORMED DUE TO PREDICTABILITY OF AMP/PEN/VAN SUSCEPTIBILITY. FEW STAPHYLOCOCCUS AUREUS NO ANAEROBES ISOLATED Performed at [facility], [address], [place], [place] [address]    [facility] STAPHYLOCOCCUS AUREUS 09/30/2018     Lab Results  Component Value Date   ALBUMIN 3.5 09/27/2018   LABURIC 4.4 10/09/2018   LABURIC 2.7 (L) 09/28/2018    Body mass index is 23.01 kg/m.  Orders:  No orders of the defined types were placed in this encounter.  No orders of the defined types were placed in this encounter.    Procedures: No procedures performed  Clinical Data: No additional findings.  ROS:  All other systems negative, except as noted in the HPI. Review of Systems  Objective: Vital Signs: Ht 5\' 11"  (1.803 m)   Wt 165 lb (74.8 kg)   BMI 23.01 kg/m   Specialty Comments:  No specialty comments available.  PMFS History: Patient Active Problem List   Diagnosis Date Noted  . Diabetic foot infection (HCC)   . Cellulitis of right foot   . Diabetic polyneuropathy associated with type 2 diabetes mellitus (HCC)   . Cellulitis and abscess of foot 09/27/2018  . Abscess of lower back 08/15/2015  . Cough 03/18/2015  . CAP (community acquired pneumonia) 03/18/2015  . Dysphagia, pharyngoesophageal phase 03/18/2015  . 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 Diabetes mellitus (HCC)     Family History  Problem Relation Age of Onset  . Hypertension Mother   . Hypertension Father   . Aneurysm Father   . Breast cancer Maternal Grandmother   . Alcohol abuse Neg Hx   . Cancer Neg Hx   . COPD Neg Hx   . Diabetes Neg Hx   . Early death Neg Hx   . Drug abuse Neg Hx   . Hearing loss Neg Hx   . Heart disease Neg Hx   . Hyperlipidemia Neg Hx   . Kidney disease Neg Hx   . Stroke Neg Hx     Past Surgical History:  Procedure Laterality Date  . HERNIA REPAIR  1995  . I&D EXTREMITY Right 09/30/2018    Procedure: IRRIGATION AND DEBRIDEMENT R foot abscess;  Surgeon: Nadara Mustard, MD;  Location: [facility] OR;  Service: Orthopedics;  Laterality: Right;  . TONSILLECTOMY  2004   Social History   Occupational History  . Occupation: Chef  Tobacco Use  . Smoking status: Never Smoker  . Smokeless tobacco: Never Used  Substance and Sexual Activity  . Alcohol use: Yes    Alcohol/week: 1.0 standard drinks    Types: 1 Glasses of wine per week  . Drug use: No  . Sexual activity: Not Currently

## 2018-10-20 ENCOUNTER — Telehealth (INDEPENDENT_AMBULATORY_CARE_PROVIDER_SITE_OTHER): Payer: Self-pay | Admitting: *Deleted

## 2018-10-20 NOTE — Telephone Encounter (Signed)
Asked COVID-19 pre screening questions to patient and they answered no to all. 

## 2018-10-23 ENCOUNTER — Ambulatory Visit (INDEPENDENT_AMBULATORY_CARE_PROVIDER_SITE_OTHER): Payer: Self-pay | Admitting: Physician Assistant

## 2018-10-23 ENCOUNTER — Encounter (INDEPENDENT_AMBULATORY_CARE_PROVIDER_SITE_OTHER): Payer: Self-pay | Admitting: Orthopedic Surgery

## 2018-10-23 ENCOUNTER — Other Ambulatory Visit: Payer: Self-pay

## 2018-10-23 VITALS — Ht 71.0 in | Wt 165.0 lb

## 2018-10-23 DIAGNOSIS — E1142 Type 2 diabetes mellitus with diabetic polyneuropathy: Secondary | ICD-10-CM

## 2018-10-23 DIAGNOSIS — M1A0710 Idiopathic chronic gout, right ankle and foot, without tophus (tophi): Secondary | ICD-10-CM

## 2018-10-23 DIAGNOSIS — L97512 Non-pressure chronic ulcer of other part of right foot with fat layer exposed: Secondary | ICD-10-CM

## 2018-10-23 MED ORDER — DOXYCYCLINE HYCLATE 100 MG PO CAPS: 100.0000 mg | ORAL_CAPSULE | Freq: Two times a day (BID) | ORAL | 1 refills | Status: DC

## 2018-10-23 MED FILL — metFORMIN HCL 1000 MG TABS: 1000 | 90 days supply | Qty: 180 | Fill #1

## 2018-10-23 MED FILL — NOVOLIN 70/30 100 UNITS/ML: (70-30) 100 | 20 days supply | Qty: 10 | Fill #1

## 2018-10-23 MED FILL — DOXYCYCLINE HYCLATE 100 MG: 100 | 14 days supply | Qty: 28 | Fill #0

## 2018-10-23 NOTE — Progress Notes (Signed)
Office Visit Note   Patient: Ian Woodard           Date of Birth: [birth year]           MRN: [id number] Visit Date: 10/23/2018              Requested by: [name], MD [address] [place], [place] [address] PCP: [name], MD  Chief Complaint  Patient presents with  . Right Foot - Routine Post Op    09/30/2018 I&D right great toe      HPI: The patient is a 48 year old gentleman who has a history of type 2 diabetes mellitus with insensate neuropathy who developed a ulcer and abscess over the right great toe.  He underwent irrigation and debridement of the right great toe on 09/30/2018 with necrotic abscess tissue noted and some tissue that had the appearance of tophaceous gout material.  Operative cultures grew group B strep.  He continues on Keflex.  He has been offloading and utilizing a knee scooter for ambulation.  His blood sugars are now under better control but he reports he is about to run out of his metformin and insulin.  [facility] and he was instructed to call there and get these refilled.  He has no pain over the area.  He has developed a new area of ulceration over the plantar surface of the right great toe however.  He has noticed some odor and some slight pink drainage and swelling.  Assessment & Plan: Visit Diagnoses:  1. Ulcer of toe of right foot, with fat layer exposed (HCC)   2. Type 2 diabetes mellitus with diabetic polyneuropathy, without long-term current use of insulin (HCC)   3. Chronic idiopathic gout involving toe of right foot without tophus     Plan: Sutures were left in place.  A new small ulcer over the plantar surface of the right great toe was debrided with a #10 blade knife and silver nitrate was used to achieve hemostasis.  He tolerated this well.  He is going to continue on Keflex.  Also going to start doxycycline once he completes the Keflex 100 mg p.o. twice daily for 2  weeks.  He is going to refill metformin and insulin to keep his blood sugars under control.  Have also recommended he utilize a Vive silver compression sock to wear around the clock except for continuing with showering with Dial soap and water.  He is going to continue to offload and utilize his knee scooter is much as possible.  He will follow-up in 1 week.  Follow-Up Instructions: Return in about 1 week (around 10/30/2018).   Ortho Exam  Patient is alert, oriented, no adenopathy, well-dressed, normal affect, normal respiratory effort. There is residual edema over the incisional area and then the sutures remain under tension.  There is some mild yellow slough.  There is some mild peri-incisional redness.  There is new plantar surface ulcer over the right great toe.  There was black eschar present and this was sharply debrided with a #10 blade knife and the patient tolerated this well.  He has good pedal pulses.      Imaging: No results found. No images are attached to the encounter.  Labs: Lab Results  Component Value Date   HGBA1C 12.7 (H) 09/29/2018   LABURIC 4.4 10/09/2018   LABURIC 2.7 (L) 09/28/2018   REPTSTATUS 10/05/2018 FINAL 09/30/2018  GRAMSTAIN  09/30/2018    RARE WBC PRESENT, PREDOMINANTLY PMN FEW GRAM POSITIVE COCCI IN PAIRS IN CHAINS    CULT  09/30/2018    ABUNDANT STREPTOCOCCUS AGALACTIAE TESTING AGAINST S. AGALACTIAE NOT ROUTINELY PERFORMED DUE TO PREDICTABILITY OF AMP/PEN/VAN SUSCEPTIBILITY. FEW STAPHYLOCOCCUS AUREUS NO ANAEROBES ISOLATED Performed at [facility], [address], [place], [place] [address]    [facility] STAPHYLOCOCCUS AUREUS 09/30/2018     Lab Results  Component Value Date   ALBUMIN 3.5 09/27/2018   LABURIC 4.4 10/09/2018   LABURIC 2.7 (L) 09/28/2018    Body mass index is 23.01 kg/m.  Orders:  No orders of the defined types were placed in this encounter.  Meds ordered this encounter  Medications  . doxycycline (VIBRAMYCIN)  100 MG capsule    Sig: Take 1 capsule (100 mg total) by mouth 2 (two) times daily.    Dispense:  28 capsule    Refill:  1     Procedures: No procedures performed  Clinical Data: No additional findings.  ROS:  All other systems negative, except as noted in the HPI. Review of Systems  Objective: Vital Signs: Ht 5\' 11"  (1.803 m)   Wt 165 lb (74.8 kg)   BMI 23.01 kg/m   Specialty Comments:  No specialty comments available.  PMFS History: Patient Active Problem List   Diagnosis Date Noted  . Diabetic foot infection (HCC)   . Cellulitis of right foot   . Diabetic polyneuropathy associated with type 2 diabetes mellitus (HCC)   . Cellulitis and abscess of foot 09/27/2018  . Abscess of lower back 08/15/2015  . Cough 03/18/2015  . CAP (community acquired pneumonia) 03/18/2015  . Dysphagia, pharyngoesophageal phase 03/18/2015  . 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 Diabetes mellitus (HCC)     Family History  Problem Relation Age of Onset  . Hypertension Mother   . Hypertension Father   . Aneurysm Father   . Breast cancer Maternal Grandmother   . Alcohol abuse Neg Hx   . Cancer Neg Hx   . COPD Neg Hx   . Diabetes Neg Hx   . Early death Neg Hx   . Drug abuse Neg Hx   . Hearing loss Neg Hx   . Heart disease Neg Hx   . Hyperlipidemia Neg Hx   . Kidney disease Neg Hx   . Stroke Neg Hx     Past Surgical History:  Procedure Laterality Date  . HERNIA REPAIR  1995  . I&D EXTREMITY Right 09/30/2018   Procedure: IRRIGATION AND DEBRIDEMENT R foot abscess;  Surgeon: Nadara Mustard, MD;  Location: [facility] OR;  Service: Orthopedics;  Laterality: Right;  . TONSILLECTOMY  2004   Social History   Occupational History  . Occupation: Chef  Tobacco Use  . Smoking status: Never Smoker  . Smokeless tobacco: Never Used  Substance and Sexual Activity  . Alcohol use: Yes    Alcohol/week: 1.0 standard drinks    Types: 1 Glasses  of wine per week  . Drug use: No  . Sexual activity: Not Currently

## 2018-10-24 ENCOUNTER — Ambulatory Visit: Payer: Self-pay | Admitting: Family Medicine

## 2018-10-24 ENCOUNTER — Inpatient Hospital Stay: Payer: Self-pay | Admitting: Family Medicine

## 2018-10-25 ENCOUNTER — Ambulatory Visit (INDEPENDENT_AMBULATORY_CARE_PROVIDER_SITE_OTHER): Payer: Self-pay | Admitting: Orthopedic Surgery

## 2018-10-25 ENCOUNTER — Other Ambulatory Visit: Payer: Self-pay

## 2018-10-25 ENCOUNTER — Encounter (INDEPENDENT_AMBULATORY_CARE_PROVIDER_SITE_OTHER): Payer: Self-pay | Admitting: Orthopedic Surgery

## 2018-10-25 VITALS — Ht 71.0 in | Wt 165.0 lb

## 2018-10-25 DIAGNOSIS — L03115 Cellulitis of right lower limb: Secondary | ICD-10-CM

## 2018-10-25 DIAGNOSIS — L089 Local infection of the skin and subcutaneous tissue, unspecified: Secondary | ICD-10-CM

## 2018-10-25 DIAGNOSIS — E11628 Type 2 diabetes mellitus with other skin complications: Secondary | ICD-10-CM

## 2018-10-25 NOTE — Progress Notes (Signed)
Post-Op Visit Note   Patient: Ian Woodard           Date of Birth: 1970/08/24           MRN: [id number] Visit Date: 10/25/2018 PCP: [name], MD  Chief Complaint:  Chief Complaint  Patient presents with  . Right Foot - Routine Post Op    09/30/18 right GT I+D    HPI:  HPI The patient is a 48 year old gentleman who presents today status post right great toe I&D on February 29.  He has been nonweightbearing with a rolling knee scooter.  Wearing a postop shoe.  He has been wearing a 5 medical compression stocking since last visit.  He did have a fall today and is concerned for worsening of his incision..  Is taking Keflex twice daily. Ortho Exam On examination the medial incision is approximated with sutures.  There has been some mild dehiscence.  There is disc eschar in the wound bed no active drainage.  No surrounding cellulitis or warmth.  There is no odor.  However he does have a plantar wound to his great toe, had a blister open up.  This is 15 mm x 10 mm 1 mm of depth there is 100% fibrinous tissue in the wound bed.  There is some serous drainage.  To the Achillesarea he has some discoloration, impending skin breakdown. No open area, no drainage.   Visit Diagnoses:  1. Diabetic foot infection (HCC)   2. Cellulitis of right foot     Plan:continue with daily dial soap cleansing. Apply iodosorb to plantar aspect toe ulcer. Vive sock with direct skin contact. Follow up in 1 week for re evaluation. Suture removal.   Follow-Up Instructions: Return in about 1 week (around 11/01/2018).   Imaging: No results found.  Orders:  No orders of the defined types were placed in this encounter.  No orders of the defined types were placed in this encounter.    PMFS History: Patient Active Problem List   Diagnosis Date Noted  . Diabetic foot infection (HCC)   . Cellulitis of right foot   . Diabetic polyneuropathy associated with type 2 diabetes mellitus (HCC)   . Cellulitis and  abscess of foot 09/27/2018  . Abscess of lower back 08/15/2015  . Cough 03/18/2015  . CAP (community acquired pneumonia) 03/18/2015  . Dysphagia, pharyngoesophageal phase 03/18/2015  . 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 Diabetes mellitus (HCC)     Family History  Problem Relation Age of Onset  . Hypertension Mother   . Hypertension Father   . Aneurysm Father   . Breast cancer Maternal Grandmother   . Alcohol abuse Neg Hx   . Cancer Neg Hx   . COPD Neg Hx   . Diabetes Neg Hx   . Early death Neg Hx   . Drug abuse Neg Hx   . Hearing loss Neg Hx   . Heart disease Neg Hx   . Hyperlipidemia Neg Hx   . Kidney disease Neg Hx   . Stroke Neg Hx     Past Surgical History:  Procedure Laterality Date  . HERNIA REPAIR  1995  . I&D EXTREMITY Right 09/30/2018   Procedure: IRRIGATION AND DEBRIDEMENT R foot abscess;  Surgeon: Nadara Mustard, MD;  Location: [facility] OR;  Service: Orthopedics;  Laterality: Right;  . TONSILLECTOMY  2004   Social History   Occupational History  . Occupation: Investment banker, operational  Tobacco Use  . Smoking status: Never Smoker  . Smokeless tobacco: Never Used  Substance and Sexual Activity  . Alcohol use: Yes    Alcohol/week: 1.0 standard drinks    Types: 1 Glasses of wine per week  . Drug use: No  . Sexual activity: Not Currently

## 2018-10-30 ENCOUNTER — Ambulatory Visit (INDEPENDENT_AMBULATORY_CARE_PROVIDER_SITE_OTHER): Payer: Self-pay | Admitting: Orthopedic Surgery

## 2018-11-01 ENCOUNTER — Telehealth (INDEPENDENT_AMBULATORY_CARE_PROVIDER_SITE_OTHER): Payer: Self-pay

## 2018-11-01 NOTE — Telephone Encounter (Signed)
I called pt and he answered NO to all COVID-19 questions.  Pt has an appt with Dr. Lajoyce Corners tomorrow at [facility]

## 2018-11-02 ENCOUNTER — Other Ambulatory Visit: Payer: Self-pay

## 2018-11-02 ENCOUNTER — Encounter (INDEPENDENT_AMBULATORY_CARE_PROVIDER_SITE_OTHER): Payer: Self-pay | Admitting: Orthopedic Surgery

## 2018-11-02 ENCOUNTER — Ambulatory Visit (INDEPENDENT_AMBULATORY_CARE_PROVIDER_SITE_OTHER): Payer: Self-pay | Admitting: Orthopedic Surgery

## 2018-11-02 VITALS — Ht 71.0 in | Wt 165.0 lb

## 2018-11-02 DIAGNOSIS — L03115 Cellulitis of right lower limb: Secondary | ICD-10-CM

## 2018-11-07 ENCOUNTER — Encounter (INDEPENDENT_AMBULATORY_CARE_PROVIDER_SITE_OTHER): Payer: Self-pay | Admitting: Orthopedic Surgery

## 2018-11-07 NOTE — Progress Notes (Signed)
Office Visit Note   Patient: Ian Woodard           Date of Birth: [birth year]           MRN: [id number] Visit Date: 11/02/2018              Requested by: [name], MD [address] [place], [place] [address] PCP: [name], MD  Chief Complaint  Patient presents with  . Right Foot - Routine Post Op    09/30/2018 right GT I&D      HPI: Patient is a 48 year old gentleman who presents in follow-up status post irrigation debridement abscess right great toe.  Patient is currently nonweightbearing with a kneeling scooter he has a medical compression sock in place he is using Iodosorb.  Assessment & Plan: Visit Diagnoses:  1. Cellulitis of right foot     Plan: Recommended continue with the kneeling scooter continue with Iodosorb continue with doxycycline.  Follow-Up Instructions: Return in about 1 week (around 11/09/2018).   Ortho Exam  Patient is alert, oriented, no adenopathy, well-dressed, normal affect, normal respiratory effort. Examination patient has a strong dorsalis pedis pulse the redness around the great toe does resolve with elevation and massage.  There is no drainage there is still some ischemic changes along the surgical incision the sutures are harvested.  Imaging: No results found. No images are attached to the encounter.  Labs: Lab Results  Component Value Date   HGBA1C 12.7 (H) 09/29/2018   LABURIC 4.4 10/09/2018   LABURIC 2.7 (L) 09/28/2018   REPTSTATUS 10/05/2018 FINAL 09/30/2018   GRAMSTAIN  09/30/2018    RARE WBC PRESENT, PREDOMINANTLY PMN FEW GRAM POSITIVE COCCI IN PAIRS IN CHAINS    CULT  09/30/2018    ABUNDANT STREPTOCOCCUS AGALACTIAE TESTING AGAINST S. AGALACTIAE NOT ROUTINELY PERFORMED DUE TO PREDICTABILITY OF AMP/PEN/VAN SUSCEPTIBILITY. FEW STAPHYLOCOCCUS AUREUS NO ANAEROBES ISOLATED Performed at [facility], [address] [place], [place] [address]    [facility] STAPHYLOCOCCUS AUREUS 09/30/2018     Lab  Results  Component Value Date   ALBUMIN 3.5 09/27/2018   LABURIC 4.4 10/09/2018   LABURIC 2.7 (L) 09/28/2018    Body mass index is 23.01 kg/m.  Orders:  No orders of the defined types were placed in this encounter.  No orders of the defined types were placed in this encounter.    Procedures: No procedures performed  Clinical Data: No additional findings.  ROS:  All other systems negative, except as noted in the HPI. Review of Systems  Objective: Vital Signs: Ht 5\' 11"  (1.803 m)   Wt 165 lb (74.8 kg)   BMI 23.01 kg/m   Specialty Comments:  No specialty comments available.  PMFS History: Patient Active Problem List   Diagnosis Date Noted  . Diabetic foot infection (HCC)   . Cellulitis of right foot   . Diabetic polyneuropathy associated with type 2 diabetes mellitus (HCC)   . Cellulitis and abscess of foot 09/27/2018  . Abscess of lower back 08/15/2015  . Cough 03/18/2015  . CAP (community acquired pneumonia) 03/18/2015  . Dysphagia, pharyngoesophageal phase 03/18/2015  . 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 Diabetes mellitus (HCC)     Family History  Problem Relation Age of Onset  . Hypertension Mother   . Hypertension Father   . Aneurysm Father   . Breast cancer Maternal Grandmother   . Alcohol abuse Neg Hx   .  Cancer Neg Hx   . COPD Neg Hx   . Diabetes Neg Hx   . Early death Neg Hx   . Drug abuse Neg Hx   . Hearing loss Neg Hx   . Heart disease Neg Hx   . Hyperlipidemia Neg Hx   . Kidney disease Neg Hx   . Stroke Neg Hx     Past Surgical History:  Procedure Laterality Date  . HERNIA REPAIR  1995  . I&D EXTREMITY Right 09/30/2018   Procedure: IRRIGATION AND DEBRIDEMENT R foot abscess;  Surgeon: Nadara Mustarduda, Marcus V, MD;  [facility];  Service: Orthopedics;  Laterality: Right;  . TONSILLECTOMY  2004   Social History   Occupational History  . Occupation: Chef  Tobacco Use  . Smoking  status: Never Smoker  . Smokeless tobacco: Never Used  Substance and Sexual Activity  . Alcohol use: Yes    Alcohol/week: 1.0 standard drinks    Types: 1 Glasses of wine per week  . Drug use: No  . Sexual activity: Not Currently

## 2018-11-08 ENCOUNTER — Other Ambulatory Visit: Payer: Self-pay | Admitting: Pharmacist

## 2018-11-08 ENCOUNTER — Telehealth (INDEPENDENT_AMBULATORY_CARE_PROVIDER_SITE_OTHER): Payer: Self-pay

## 2018-11-08 MED ORDER — "INSULIN SYRINGE-NEEDLE U-100 30G X 1/2"" 0.5 ML MISC": 11 refills | Status: DC

## 2018-11-08 MED FILL — DOXYCYCLINE HYCLATE 100 MG: 100 | 14 days supply | Qty: 28 | Fill #1

## 2018-11-08 MED FILL — NOVOLIN 70/30 100 UNITS/ML: (70-30) 100 | 20 days supply | Qty: 10 | Fill #0

## 2018-11-08 MED FILL — ULTICARE INS 0.5 ML 30GX1/2: 30G X 1/2" | 100 days supply | Qty: 100 | Fill #0

## 2018-11-08 NOTE — Telephone Encounter (Signed)
Called and sw pt. Asked all the prescreen COVID-19 questions all answers were NO. Pt has an appt tomorrow at [facility]

## 2018-11-09 ENCOUNTER — Ambulatory Visit (INDEPENDENT_AMBULATORY_CARE_PROVIDER_SITE_OTHER): Payer: Self-pay | Admitting: Orthopedic Surgery

## 2018-11-09 ENCOUNTER — Other Ambulatory Visit: Payer: Self-pay

## 2018-11-09 ENCOUNTER — Encounter (INDEPENDENT_AMBULATORY_CARE_PROVIDER_SITE_OTHER): Payer: Self-pay | Admitting: Orthopedic Surgery

## 2018-11-09 VITALS — Ht 71.0 in | Wt 165.0 lb

## 2018-11-09 DIAGNOSIS — L97512 Non-pressure chronic ulcer of other part of right foot with fat layer exposed: Secondary | ICD-10-CM

## 2018-11-13 ENCOUNTER — Encounter (INDEPENDENT_AMBULATORY_CARE_PROVIDER_SITE_OTHER): Payer: Self-pay | Admitting: Orthopedic Surgery

## 2018-11-13 NOTE — Progress Notes (Signed)
Office Visit Note   Patient: Ian Woodard           Date of Birth: 07-19-1971           MRN: [id number] Visit Date: 11/09/2018              Requested by: [name], MD [address] [place], [place] [address] PCP: [name], MD  Chief Complaint  Patient presents with  . Right Foot - Routine Post Op    09/30/2018 right GT I&D      HPI: Patient is a 48 year old gentleman who presents status post irrigation debridement right great toe.  Patient is currently wearing medical compression stocking 24 hours a day changes this the shower he is taking doxycycline twice a day and Iodosorb dressing changes daily.  He is on a kneeling scooter with a postop shoe.  Patient states that he has constipation from the narcotic pain medicine.  Patient states he feels much better and the wound is looking better.  Assessment & Plan: Visit Diagnoses:  1. Ulcer of toe of right foot, with fat layer exposed (HCC)     Plan: Recommended magnesium citrate for the constipation and weaning off the narcotic pain medicine.  Patient will complete his course of doxycycline.  Discussed that he could drive but would still maintain minimize weightbearing for the right lower extremity.  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  Follow-Up Instructions: No follow-ups on file.   Ortho Exam  Patient is alert, oriented, no adenopathy, well-dressed, normal affect, normal respiratory effort. Examination patient still has dependent redness with massage and elevation this resolves there is no tenderness to palpation there is no cellulitis no induration the surgical incision is gapped open about a millimeter.  Imaging: No results found. No images are attached to the encounter.  Labs: Lab Results  Component Value Date   HGBA1C 12.7 (H) 09/29/2018   LABURIC 4.4 10/09/2018   LABURIC 2.7 (L) 09/28/2018   REPTSTATUS 10/05/2018 FINAL 09/30/2018   GRAMSTAIN  09/30/2018    RARE WBC PRESENT,  PREDOMINANTLY PMN FEW GRAM POSITIVE COCCI IN PAIRS IN CHAINS    CULT  09/30/2018    ABUNDANT STREPTOCOCCUS AGALACTIAE TESTING AGAINST S. AGALACTIAE NOT ROUTINELY PERFORMED DUE TO PREDICTABILITY OF AMP/PEN/VAN SUSCEPTIBILITY. FEW STAPHYLOCOCCUS AUREUS NO ANAEROBES ISOLATED Performed at [facility], [address] [place], [place] [address]    [facility] STAPHYLOCOCCUS AUREUS 09/30/2018     Lab Results  Component Value Date   ALBUMIN 3.5 09/27/2018   LABURIC 4.4 10/09/2018   LABURIC 2.7 (L) 09/28/2018    Body mass index is 23.01 kg/m.  Orders:  No orders of the defined types were placed in this encounter.  No orders of the defined types were placed in this encounter.    Procedures: No procedures performed  Clinical Data: No additional findings.  ROS:  All other systems negative, except as noted in the HPI. Review of Systems  Objective: Vital Signs: Ht 5\' 11"  (1.803 m)   Wt 165 lb (74.8 kg)   BMI 23.01 kg/m   Specialty Comments:  No specialty comments available.  PMFS History: Patient Active Problem List   Diagnosis Date Noted  . Diabetic foot infection (HCC)   . Cellulitis of right foot   . Diabetic polyneuropathy associated with type 2 diabetes mellitus (HCC)   . Cellulitis and abscess of foot 09/27/2018  . Abscess of lower back 08/15/2015  . Cough  03/18/2015  . CAP (community acquired pneumonia) 03/18/2015  . Dysphagia, pharyngoesophageal phase 03/18/2015  . 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 Diabetes mellitus (HCC)     Family History  Problem Relation Age of Onset  . Hypertension Mother   . Hypertension Father   . Aneurysm Father   . Breast cancer Maternal Grandmother   . Alcohol abuse Neg Hx   . Cancer Neg Hx   . COPD Neg Hx   . Diabetes Neg Hx   . Early death Neg Hx   . Drug abuse Neg Hx   . Hearing loss Neg Hx   . Heart disease Neg Hx   . Hyperlipidemia Neg Hx   .  Kidney disease Neg Hx   . Stroke Neg Hx     Past Surgical History:  Procedure Laterality Date  . HERNIA REPAIR  1995  . I&D EXTREMITY Right 09/30/2018   Procedure: IRRIGATION AND DEBRIDEMENT R foot abscess;  Surgeon: Nadara Mustarduda, Lamarco Gudiel V, MD;  [facility];  Service: Orthopedics;  Laterality: Right;  . TONSILLECTOMY  2004   Social History   Occupational History  . Occupation: Chef  Tobacco Use  . Smoking status: Never Smoker  . Smokeless tobacco: Never Used  Substance and Sexual Activity  . Alcohol use: Yes    Alcohol/week: 1.0 standard drinks    Types: 1 Glasses of wine per week  . Drug use: No  . Sexual activity: Not Currently

## 2018-11-21 ENCOUNTER — Ambulatory Visit (INDEPENDENT_AMBULATORY_CARE_PROVIDER_SITE_OTHER): Payer: Self-pay | Admitting: Orthopedic Surgery

## 2018-11-21 ENCOUNTER — Other Ambulatory Visit: Payer: Self-pay

## 2018-11-21 ENCOUNTER — Encounter (INDEPENDENT_AMBULATORY_CARE_PROVIDER_SITE_OTHER): Payer: Self-pay | Admitting: Orthopedic Surgery

## 2018-11-21 VITALS — Ht 71.0 in | Wt 165.0 lb

## 2018-11-21 DIAGNOSIS — L97512 Non-pressure chronic ulcer of other part of right foot with fat layer exposed: Secondary | ICD-10-CM

## 2018-11-21 NOTE — Progress Notes (Signed)
Office Visit Note   Patient: Ian HammedBrent J Kerekes           Date of Birth: 02/28/1971           MRN: [id number] Visit Date: 11/21/2018              Requested by: Etta GrandchildJones, Thomas L, MD [address] [place], [place] [address] PCP: Etta GrandchildJones, Thomas L, MD  Chief Complaint  Patient presents with  . Right Foot - Routine Post Op    09/30/18 I&D right foot      HPI: Patient is a 48 year old gentleman status post irrigation debridement right foot.  Patient is showing progressive healing he will complete his antibiotics on Monday.  Assessment & Plan: Visit Diagnoses:  1. Ulcer of toe of right foot, with fat layer exposed (HCC)     Plan: Recommended continue to the medical compression sock and wash this in the washing machine.  Recommended probiotics daily due to his prolonged use of antibiotics.  He may advance to weightbearing as tolerated in the postoperative shoe.  At follow-up evaluate for return to work.  Follow-Up Instructions: Return in about 2 weeks (around 12/05/2018).   Ortho Exam  Patient is alert, oriented, no adenopathy, well-dressed, normal affect, normal respiratory effort. Examination the surgical incision shows progressive healing.  There is scab removed there is good healthy granulation tissue that is bleeding beneath the scab.  The swelling and redness is resolving.  Imaging: No results found. No images are attached to the encounter.  Labs: Lab Results  Component Value Date   HGBA1C 12.7 (H) 09/29/2018   LABURIC 4.4 10/09/2018   LABURIC 2.7 (L) 09/28/2018   REPTSTATUS 10/05/2018 FINAL 09/30/2018   GRAMSTAIN  09/30/2018    RARE WBC PRESENT, PREDOMINANTLY PMN FEW GRAM POSITIVE COCCI IN PAIRS IN CHAINS    CULT  09/30/2018    ABUNDANT STREPTOCOCCUS AGALACTIAE TESTING AGAINST S. AGALACTIAE NOT ROUTINELY PERFORMED DUE TO PREDICTABILITY OF AMP/PEN/VAN SUSCEPTIBILITY. FEW STAPHYLOCOCCUS AUREUS NO ANAEROBES ISOLATED Performed at [facility], [address] [place], [place] [address]    [facility] 09/30/2018     Lab Results  Component Value Date   ALBUMIN 3.5 09/27/2018   LABURIC 4.4 10/09/2018   LABURIC 2.7 (L) 09/28/2018    Body mass index is 23.01 kg/m.  Orders:  No orders of the defined types were placed in this encounter.  No orders of the defined types were placed in this encounter.    Procedures: No procedures performed  Clinical Data: No additional findings.  ROS:  All other systems negative, except as noted in the HPI. Review of Systems  Objective: Vital Signs: Ht 5\' 11"  (1.803 m)   Wt 165 lb (74.8 kg)   BMI 23.01 kg/m   Specialty Comments:  No specialty comments available.  PMFS History: Patient Active Problem List   Diagnosis Date Noted  . Diabetic foot infection (HCC)   . Cellulitis of right foot   . Diabetic polyneuropathy associated with type 2 diabetes mellitus (HCC)   . Cellulitis and abscess of foot 09/27/2018  . Abscess of lower back 08/15/2015  . Cough 03/18/2015  . CAP (community acquired pneumonia) 03/18/2015  . Dysphagia, pharyngoesophageal phase 03/18/2015  . 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 Diabetes mellitus (HCC)     Family History  Problem Relation Age of Onset  . Hypertension Mother   . Hypertension Father   .  Aneurysm Father   . Breast cancer Maternal Grandmother   . Alcohol abuse Neg Hx   . Cancer Neg Hx   . COPD Neg Hx   . Diabetes Neg Hx   . Early death Neg Hx   . Drug abuse Neg Hx   . Hearing loss Neg Hx   . Heart disease Neg Hx   . Hyperlipidemia Neg Hx   . Kidney disease Neg Hx   . Stroke Neg Hx     Past Surgical History:  Procedure Laterality Date  . HERNIA REPAIR  1995  . I&D EXTREMITY Right 09/30/2018   Procedure: IRRIGATION AND DEBRIDEMENT R foot abscess;  Surgeon: Nadara Mustard, MD;  [facility];  Service: Orthopedics;  Laterality: Right;  . TONSILLECTOMY  2004    Social History   Occupational History  . Occupation: Chef  Tobacco Use  . Smoking status: Never Smoker  . Smokeless tobacco: Never Used  Substance and Sexual Activity  . Alcohol use: Yes    Alcohol/week: 1.0 standard drinks    Types: 1 Glasses of wine per week  . Drug use: No  . Sexual activity: Not Currently

## 2018-11-22 ENCOUNTER — Other Ambulatory Visit: Payer: Self-pay | Admitting: Nurse Practitioner

## 2018-11-22 DIAGNOSIS — E1142 Type 2 diabetes mellitus with diabetic polyneuropathy: Secondary | ICD-10-CM

## 2018-11-22 MED FILL — NOVOLIN 70/30 100 UNITS/ML: (70-30) 100 | 40 days supply | Qty: 20 | Fill #0

## 2018-11-28 ENCOUNTER — Telehealth: Payer: Self-pay

## 2018-11-28 NOTE — Telephone Encounter (Signed)
Called patient to do their pre-visit COVID screening.  Have you recently traveled internationally([place], [place], [place], [place], [place]) or within the [place] to a hotspot area([place], [place], [place], [place], [place])? no  Are you currently experiencing any of the following: fever, cough, SHOB, fatigue? no  Have you been in contact with anyone who has recently travelled? no  Have you been in contact with anyone who is experiencing fever, cough, SHOB, fatigue or been diagnosed with COVID  or works in or has recently visited a SNF? no  

## 2018-11-29 ENCOUNTER — Encounter: Payer: Self-pay | Admitting: Family Medicine

## 2018-11-29 ENCOUNTER — Ambulatory Visit (INDEPENDENT_AMBULATORY_CARE_PROVIDER_SITE_OTHER): Payer: Self-pay | Admitting: Family Medicine

## 2018-11-29 DIAGNOSIS — M1A9XX0 Chronic gout, unspecified, without tophus (tophi): Secondary | ICD-10-CM

## 2018-11-29 DIAGNOSIS — L97512 Non-pressure chronic ulcer of other part of right foot with fat layer exposed: Secondary | ICD-10-CM

## 2018-11-29 DIAGNOSIS — E1159 Type 2 diabetes mellitus with other circulatory complications: Secondary | ICD-10-CM

## 2018-11-29 DIAGNOSIS — E11621 Type 2 diabetes mellitus with foot ulcer: Secondary | ICD-10-CM

## 2018-11-29 DIAGNOSIS — G4700 Insomnia, unspecified: Secondary | ICD-10-CM

## 2018-11-29 MED ORDER — TRAZODONE HCL 50 MG PO TABS: 50.0000 mg | ORAL_TABLET | Freq: Every evening | ORAL | 0 refills | Status: DC | PRN

## 2018-11-29 MED ORDER — GABAPENTIN 300 MG PO CAPS: 300.0000 mg | ORAL_CAPSULE | Freq: Two times a day (BID) | ORAL | 1 refills | Status: DC | PRN

## 2018-11-29 MED FILL — traZODone HCL 50 MG TABS: 50 | 30 days supply | Qty: 60 | Fill #0

## 2018-11-29 MED FILL — GABAPENTIN 300 MG CAPSULE: 300 | 30 days supply | Qty: 60 | Fill #0

## 2018-11-29 NOTE — Progress Notes (Signed)
Virtual Visit via Telephone Note  I connected with Ian Woodard on 11/29/18 at  2:50 PM EDT by telephone and verified that I am speaking with the correct person using two identifiers.  Location: Patient: Located at home during today's encounter. Provider: Located at primary care office during today's encounter.   I discussed the limitations, risks, security and privacy concerns of performing an evaluation and management service by telephone and the availability of in person appointments. I also discussed with the patient that there may be a patient responsible charge related to this service. The patient expressed understanding and agreed to proceed.  Ian Woodard has Cough; CAP (community acquired pneumonia); Dysphagia, pharyngoesophageal phase; Erectile dysfunction of organic origin; Abscess of lower back; Cellulitis and abscess of foot; Diabetic polyneuropathy associated with type 2 diabetes mellitus (HCC); Diabetic foot infection (HCC); Cellulitis of right foot; Diabetes mellitus (HCC); Insomnia; and Chronic gout without tophus on their problem list.    History of Present Illness: Type 2 Diabetes, poorly controlled. Ian Woodard is establishing care today. He recently was seen at our main clinic [facility], however, [facility] is closer to his residence and therefore he wishes to receive primary care here. Medical history is significant for uncontrolled diabetes (dx 2004) and was placed on medication and some point stopped due to improvement of glycemic control. Most recent A1C 12.7 (2 months prior). Current home readings have improved significantly with no postprandial readings greater than 170 and fasting readings mostly less than 125. He has not experienced hypoglycemia. He reports a eating a modified carbohydrate diet which has helped with glycemic control.  Currently has a diabetic foot ulcer to right foot/cellulitus infection, for which he is followed by Dr. Aldean BakerMarcus Duda. He is also endorsing  worrisome         bilateral foot neuropathic pain which is worst at night time. Characterizes pain as pen, needles, and numbness. Currently not prescribed medication for this problem.  Insomnia  Reports a long history of difficulty with falling asleep and staying asleep. Problem comes and goes. He has been prescribed Ambien in the past which was ineffective. He has tried OTC and natural medicines which are not effective. Thinks he has been prescribed Trazodone at some point and doesn't recall an issue with this medication. Occasionally takes his partners Xanax which induces sleep.   Gout History of recurrent gout. Reports no active attack at present. He has taken indomethacin in the past for problem. Recent uric acid level was below normal range 2.7.  No recent primary care. Needs PNA vaccines, foot exam, eye exam, fasting lipid panel, TSH, CMP and urine microalbumin.  Assessment and Plan: 1. Diabetic ulcer of right foot associated with type 2 diabetes mellitus, with fat layer exposed, unspecified part of foot (HCC) 2. Type 2 diabetes mellitus with other circulatory complication, without long-term current use of insulin (HCC) Currently followed by Dr. Aldean BakerMarcus Duda for management of foot ulcer. Per home readings, blood sugars have improved since resuming medication. Will need to repeat A1C and check a fasting lipid panel in 3 weeks.  Continue metformin and Novolin at current dose Gabapentin 300 mg twice daily added for neuropathic foot pain   3. Insomnia, unspecified type Trial Trazodone 50 mg initially, if needed ok to increase to 100 mg 1 hour prior to bedtime. Discussed appropriate sleep hygiene to increase quality of sleep.  4. Chronic gout without tophus, unspecified cause, unspecified site Currently no active symptoms of disease. Advised to schedule an appointment if symptom occur.  Follow Up Instructions: Patient had a list of several things he wanted addressed today. Unfortunately  given time constraints unable, to address all concerns during today's visit. Schedule a follow-up to address other concerns. Return in 3 weeks for lab visit, BP check, urine micro.   I discussed the assessment and treatment plan with the patient. The patient was provided an opportunity to ask questions and all were answered. The patient agreed with the plan and demonstrated an understanding of the instructions.   The patient was advised to call back or seek an in-person evaluation if the symptoms worsen or if the condition fails to improve as anticipated.  I provided 25 minutes of non-face-to-face time during this encounter.   Joaquin Courts, FNP

## 2018-12-01 DIAGNOSIS — E119 Type 2 diabetes mellitus without complications: Secondary | ICD-10-CM | POA: Insufficient documentation

## 2018-12-01 DIAGNOSIS — M1A9XX0 Chronic gout, unspecified, without tophus (tophi): Secondary | ICD-10-CM | POA: Insufficient documentation

## 2018-12-01 DIAGNOSIS — G4700 Insomnia, unspecified: Secondary | ICD-10-CM | POA: Insufficient documentation

## 2018-12-05 ENCOUNTER — Ambulatory Visit (INDEPENDENT_AMBULATORY_CARE_PROVIDER_SITE_OTHER): Payer: Self-pay | Admitting: Orthopedic Surgery

## 2018-12-05 ENCOUNTER — Other Ambulatory Visit: Payer: Self-pay

## 2018-12-05 ENCOUNTER — Encounter: Payer: Self-pay | Admitting: Orthopedic Surgery

## 2018-12-05 VITALS — Ht 71.0 in | Wt 165.0 lb

## 2018-12-05 DIAGNOSIS — L97512 Non-pressure chronic ulcer of other part of right foot with fat layer exposed: Secondary | ICD-10-CM

## 2018-12-05 NOTE — Progress Notes (Signed)
Office Visit Note   Patient: Ian Woodard           Date of Birth: 03/18/1971           MRN: [id number] Visit Date: 12/05/2018              Requested by: Etta GrandchildJones, Thomas L, MD [address] [place], [place] [address] PCP: Bing NeighborsHarris, Kimberly S, FNP  Chief Complaint  Patient presents with  . Right Foot - Routine Post Op    09/30/18 right foot I&D      HPI: Patient is a 48 year old gentleman who presents 9 weeks status post irrigation and debridement infection right foot medial column.  Patient states that his foot continues to improve he is wearing the medical compression stocking.  Assessment & Plan: Visit Diagnoses:  1. Ulcer of toe of right foot, with fat layer exposed (HCC)     Plan: Patient was given instruction and demonstrated Achilles stretching to help unload pressure on the forefoot.  Recommended stiff soled sneakers such as [place].  Patient was given a note that he may return to work on Monday increasing activities as tolerated.  Follow-Up Instructions: Return in about 4 weeks (around 01/02/2019).   Ortho Exam  Patient is alert, oriented, no adenopathy, well-dressed, normal affect, normal respiratory effort. Examination the incision is well-healed there is no redness no cellulitis no drainage no signs of infection.  Patient only has dorsiflexion to neutral with only about 10 degrees of dorsiflexion of the great toe he was given instructions for Achilles stretching recommended stiff soled sneakers.  Imaging: No results found. No images are attached to the encounter.  Labs: Lab Results  Component Value Date   HGBA1C 12.7 (H) 09/29/2018   LABURIC 4.4 10/09/2018   LABURIC 2.7 (L) 09/28/2018   REPTSTATUS 10/05/2018 FINAL 09/30/2018   GRAMSTAIN  09/30/2018    RARE WBC PRESENT, PREDOMINANTLY PMN FEW GRAM POSITIVE COCCI IN PAIRS IN CHAINS    CULT  09/30/2018    ABUNDANT STREPTOCOCCUS AGALACTIAE TESTING AGAINST S. AGALACTIAE NOT ROUTINELY PERFORMED DUE TO  PREDICTABILITY OF AMP/PEN/VAN SUSCEPTIBILITY. FEW STAPHYLOCOCCUS AUREUS NO ANAEROBES ISOLATED Performed at [facility], [address] [place], [place] [address]    [facility]HY[facility]UREUS 09/30/2018     Lab Results  Component Value Date   ALBUMIN 3.5 09/27/2018   LABURIC 4.4 10/09/2018   LABURIC 2.7 (L) 09/28/2018    Body mass index is 23.01 kg/m.  Orders:  No orders of the defined types were placed in this encounter.  No orders of the defined types were placed in this encounter.    Procedures: No procedures performed  Clinical Data: No additional findings.  ROS:  All other systems negative, except as noted in the HPI. Review of Systems  Objective: Vital Signs: Ht 5\' 11"  (1.803 m)   Wt 165 lb (74.8 kg)   BMI 23.01 kg/m   Specialty Comments:  No specialty comments available.  PMFS History: Patient Active Problem List   Diagnosis Date Noted  . Diabetes mellitus (HCC) 12/01/2018  . Insomnia 12/01/2018  . Chronic gout without tophus 12/01/2018  . Diabetic foot infection (HCC)   . Cellulitis of right foot   . Diabetic polyneuropathy associated with type 2 diabetes mellitus (HCC)   . Cellulitis and abscess of foot 09/27/2018  . Abscess of lower back 08/15/2015  . 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 Diabetes mellitus (HCC)  Family History  Problem Relation Age of Onset  . Hypertension Mother   . Hypertension Father   . Aneurysm Father   . Breast cancer Maternal Grandmother   . Alcohol abuse Neg Hx   . Cancer Neg Hx   . COPD Neg Hx   . Diabetes Neg Hx   . Early death Neg Hx   . Drug abuse Neg Hx   . Hearing loss Neg Hx   . Heart disease Neg Hx   . Hyperlipidemia Neg Hx   . Kidney disease Neg Hx   . Stroke Neg Hx     Past Surgical History:  Procedure Laterality Date  . HERNIA REPAIR  1995  . I&D EXTREMITY Right 09/30/2018   Procedure: IRRIGATION AND DEBRIDEMENT R foot  abscess;  Surgeon: Nadara Mustard, MD;  Location: [facility] OR;  Service: Orthopedics;  Laterality: Right;  . TONSILLECTOMY  2004   Social History   Occupational History  . Occupation: Chef  Tobacco Use  . Smoking status: Never Smoker  . Smokeless tobacco: Never Used  Substance and Sexual Activity  . Alcohol use: Yes    Alcohol/week: 1.0 standard drinks    Types: 1 Glasses of wine per week  . Drug use: No  . Sexual activity: Not Currently

## 2018-12-11 ENCOUNTER — Encounter: Payer: Self-pay | Admitting: Family Medicine

## 2018-12-11 ENCOUNTER — Other Ambulatory Visit: Payer: Self-pay

## 2018-12-11 ENCOUNTER — Ambulatory Visit (INDEPENDENT_AMBULATORY_CARE_PROVIDER_SITE_OTHER): Payer: Self-pay | Admitting: Family Medicine

## 2018-12-11 DIAGNOSIS — K5909 Other constipation: Secondary | ICD-10-CM

## 2018-12-11 DIAGNOSIS — L853 Xerosis cutis: Secondary | ICD-10-CM

## 2018-12-11 DIAGNOSIS — N529 Male erectile dysfunction, unspecified: Secondary | ICD-10-CM

## 2018-12-11 DIAGNOSIS — E1142 Type 2 diabetes mellitus with diabetic polyneuropathy: Secondary | ICD-10-CM

## 2018-12-11 MED ORDER — TADALAFIL 20 MG PO TABS: 10.0000 mg | ORAL_TABLET | ORAL | 11 refills | Status: DC | PRN

## 2018-12-11 MED ORDER — INSULIN NPH ISOPHANE & REGULAR (70-30) 100 UNIT/ML ~~LOC~~ SUSP: SUBCUTANEOUS | 1 refills | Status: DC

## 2018-12-11 MED FILL — TADALAFIL (PAH) 20 MG TABS: 20 | 15 days supply | Qty: 5 | Fill #0

## 2018-12-11 NOTE — Progress Notes (Deleted)
Called patient to initiate their telephone visit with provider Joaquin Courts, FNP-C. Verified date of birth. Patient states that this is a continuation of his last visit. KWalker, CMA.

## 2018-12-11 NOTE — Progress Notes (Signed)
Virtual Visit via Telephone Note  I connected with Ian Woodard on 12/11/18 at  3:30 PM EDT by telephone and verified that I am speaking with the correct person using two identifiers.  Location: Patient: Located at home during today's encounter  Provider: Located at primary care office     I discussed the limitations, risks, security and privacy concerns of performing an evaluation and management service by telephone and the availability of in person appointments. I also discussed with the patient that there may be a patient responsible charge related to this service. The patient expressed understanding and agreed to proceed.   History of Present Illness: Erectile dysfunction Viagra 50-100 mg in the past and did not work . He is wanting to try a different medication. He is a diabetic and was unaware that uncontrolled diabetes could result in ED. ED symptoms have remained present for approximately 2 years. He endorses problems with both achieving an erection and maintaining an erection. Currently in a relation and this problem is causing stress. No history of MI or CVA. No recent or known prior prescribed nitrates.  Dry feet Very dry and cracked feet at the bottom. This has been chronic issue. He has used multiple over-the-counter applications without relief of foot dryness. At present, he is using mostly petroleum jelly with temporary relief. He performs routine foot checks as he is a diabetic. He also recently developed neuropathy symptoms secondary to diabetes and is concern with maintaining appropriate foot health.   Constipation  History of fecal impaction required an ER visit back in July 2019. Endorses a problem with chronic constipation and often can go days without experiencing a stool. He is currently taking three stool softeners per day and drinking large volumes of metamucil without and continues to experience harden or absent stools. He endorses very poor water intake. He is  taking probiotics daily. Last stool was small last night. Prior to last night, last stool was 5 days prior.  Assessment and Plan: 1. Diabetic polyneuropathy associated with type 2 diabetes mellitus (HCC) Medication refilled. - insulin NPH-regular Human (NOVOLIN 70/30) (70-30) 100 UNIT/ML injection; INJECT 20 UNITS INTO THE SKIN 2 (TWO) TIMES DAILY WITH A MEAL.  Dispense: 20 mL; Refill: 1  2. Erectile dysfunction of organic origin -Trial Cialis. Discussed do not exceed specified dose.  -Discontinue and follow-up if any side effects develop.  3. Chronic constipation -Encourage routine twice weekly bowel cleansing with Miralax until he is having daily stools. -Continue stool softeners daily  -Recommended discontinuing metamucil. Suspect this is worsening constipation given limited water intake. -Reinforces the importance of 6-8 glasses of plain water to facilitate healthy bowel habits.   4. Dry skin, feet -Recommended OTC Eucerin cream and petroleum jelly applications twice daily to clean feet -Continue routine surveillance of feet given diabetes.   Follow Up Instructions: Based on lab results.   I discussed the assessment and treatment plan with the patient. The patient was provided an opportunity to ask questions and all were answered. The patient agreed with the plan and demonstrated an understanding of the instructions.   The patient was advised to call back or seek an in-person evaluation if the symptoms worsen or if the condition fails to improve as anticipated.  I provided 20  minutes of non-face-to-face time during this encounter.   Joaquin Courts, FNP-C

## 2018-12-18 ENCOUNTER — Other Ambulatory Visit: Payer: Self-pay

## 2018-12-18 DIAGNOSIS — G4700 Insomnia, unspecified: Secondary | ICD-10-CM

## 2018-12-18 DIAGNOSIS — E11621 Type 2 diabetes mellitus with foot ulcer: Secondary | ICD-10-CM

## 2018-12-18 DIAGNOSIS — E1159 Type 2 diabetes mellitus with other circulatory complications: Secondary | ICD-10-CM

## 2018-12-19 ENCOUNTER — Telehealth: Payer: Self-pay | Admitting: Family Medicine

## 2018-12-19 LAB — COMPREHENSIVE METABOLIC PANEL
ALT: 13 IU/L (ref 0–44)
AST: 17 IU/L (ref 0–40)
Albumin/Globulin Ratio: 1.7 (ref 1.2–2.2)
Albumin: 4.8 g/dL (ref 4.0–5.0)
Alkaline Phosphatase: 98 IU/L (ref 39–117)
BUN/Creatinine Ratio: 16 (ref 9–20)
BUN: 12 mg/dL (ref 6–24)
Bilirubin Total: 0.4 mg/dL (ref 0.0–1.2)
CO2: 18 mmol/L — ABNORMAL LOW (ref 20–29)
Calcium: 9.6 mg/dL (ref 8.7–10.2)
Chloride: 102 mmol/L (ref 96–106)
Creatinine, Ser: 0.75 mg/dL — ABNORMAL LOW (ref 0.76–1.27)
GFR calc Af Amer: 125 mL/min/{1.73_m2} (ref >59)
GFR calc non Af Amer: 108 mL/min/{1.73_m2} (ref >59)
Globulin, Total: 2.8 g/dL (ref 1.5–4.5)
Glucose: 101 mg/dL — ABNORMAL HIGH (ref 65–99)
Potassium: 4.4 mmol/L (ref 3.5–5.2)
Sodium: 138 mmol/L (ref 134–144)
Total Protein: 7.6 g/dL (ref 6.0–8.5)

## 2018-12-19 LAB — CBC WITH DIFFERENTIAL/PLATELET
Basophils Absolute: 0.1 10*3/uL (ref 0.0–0.2)
Basos: 2 %
EOS (ABSOLUTE): 0.3 10*3/uL (ref 0.0–0.4)
Eos: 5 %
Hematocrit: 45.4 % (ref 37.5–51.0)
Hemoglobin: 15.1 g/dL (ref 13.0–17.7)
Immature Grans (Abs): 0 10*3/uL (ref 0.0–0.1)
Immature Granulocytes: 0 %
Lymphocytes Absolute: 2.3 10*3/uL (ref 0.7–3.1)
Lymphs: 38 %
MCH: 28.9 pg (ref 26.6–33.0)
MCHC: 33.3 g/dL (ref 31.5–35.7)
MCV: 87 fL (ref 79–97)
Monocytes Absolute: 0.4 10*3/uL (ref 0.1–0.9)
Monocytes: 7 %
Neutrophils Absolute: 2.9 10*3/uL (ref 1.4–7.0)
Neutrophils: 48 %
Platelets: 389 10*3/uL (ref 150–450)
RBC: 5.22 x10E6/uL (ref 4.14–5.80)
RDW: 13.3 % (ref 11.6–15.4)
WBC: 6 10*3/uL (ref 3.4–10.8)

## 2018-12-19 LAB — HEMOGLOBIN A1C
Est. average glucose Bld gHb Est-mCnc: 120 mg/dL
Hgb A1c MFr Bld: 5.8 % — ABNORMAL HIGH (ref 4.8–5.6)

## 2018-12-19 LAB — MICROALBUMIN, URINE: Microalbumin, Urine: 3.1 ug/mL

## 2018-12-19 LAB — TSH: TSH: 1.33 u[IU]/mL (ref 0.450–4.500)

## 2018-12-19 NOTE — Telephone Encounter (Signed)
Patient also states that he was told that medication changes were made & that he should have some relief of the constipation by the time he came in for his lab appointment. Patient states that he has only had 3 bowel movements between his televisit & lab appointment yesterday.  Please advise.

## 2018-12-19 NOTE — Telephone Encounter (Signed)
Recent labs indicate significant improvement of diabetes. A1C 5.8 down from 12.7. I would like to reduce Novolin 15 units BID. Please follow-up if blood sugars are consistently below 90 or if he experiences any readings less 60 in order for me to adjust insulin dose. I would like to repeat A1C again 8 weeks. Renal function is normal. I also recommend that he receives PV23 pneumococcal vaccines at the next follow-up

## 2018-12-19 NOTE — Telephone Encounter (Signed)
Left voice mail to call back 

## 2018-12-19 NOTE — Telephone Encounter (Signed)
Patient was returning a call from the nurse, please follow up °

## 2018-12-19 NOTE — Telephone Encounter (Signed)
Patient notified of lab results & recommendations. Expressed understanding. Made appt for 02/19/2019 @ 8:30 AM.

## 2018-12-20 NOTE — Telephone Encounter (Signed)
Advise patient I can prescribe Linzess 145 mcg, which is a medication for irritable bowel syndrome causing constipation. I would still recommend continuing the Miralax daily until he is able to achieve a daily stool. Discontinue stool softeners once starting Linzess. If adding Linzess is unsuccessful, the next step would be referring him to gastroenterology for further work-up.  I recommend giving treatment 1 month to reach it's maximum effectiveness.

## 2018-12-20 NOTE — Addendum Note (Signed)
Addended by: Bing Neighbors on: 12/20/2018 07:39 AM   Modules accepted: Orders

## 2018-12-21 MED ORDER — LINACLOTIDE 145 MCG PO CAPS: 145.0000 ug | ORAL_CAPSULE | Freq: Every day | ORAL | 1 refills | Status: DC

## 2018-12-21 MED FILL — !LINZESS 145 MCG CAPSULE: 145 | 30 days supply | Qty: 30 | Fill #0

## 2018-12-21 NOTE — Telephone Encounter (Signed)
Patient notified of the following. Aware to give treatment at least 1 month to reach maximum effectiveness. Prescription released to [facility].

## 2018-12-21 NOTE — Addendum Note (Signed)
Addended by: Heidi Dach on: 12/21/2018 11:48 AM   Modules accepted: Orders

## 2018-12-27 ENCOUNTER — Other Ambulatory Visit: Payer: Self-pay | Admitting: Family Medicine

## 2018-12-27 MED FILL — NOVOLIN 70/30 100 UNITS/ML: (70-30) 100 | 40 days supply | Qty: 20 | Fill #1

## 2018-12-27 MED FILL — TADALAFIL (PAH) 20 MG TABS: 20 | 30 days supply | Qty: 5 | Fill #1

## 2018-12-27 MED FILL — GABAPENTIN 300 MG CAPSULE: 300 | 30 days supply | Qty: 60 | Fill #1

## 2018-12-27 MED FILL — traZODone HCL 50 MG TABS: 50 | 30 days supply | Qty: 60 | Fill #0

## 2018-12-27 NOTE — Telephone Encounter (Signed)
Request for refills.

## 2019-01-01 ENCOUNTER — Ambulatory Visit: Payer: Self-pay | Admitting: Orthopedic Surgery

## 2019-01-03 ENCOUNTER — Ambulatory Visit (INDEPENDENT_AMBULATORY_CARE_PROVIDER_SITE_OTHER): Payer: Self-pay | Admitting: Family

## 2019-01-03 ENCOUNTER — Other Ambulatory Visit: Payer: Self-pay

## 2019-01-03 ENCOUNTER — Encounter: Payer: Self-pay | Admitting: Family

## 2019-01-03 VITALS — Ht 71.0 in | Wt 165.0 lb

## 2019-01-03 DIAGNOSIS — L02619 Cutaneous abscess of unspecified foot: Secondary | ICD-10-CM

## 2019-01-03 DIAGNOSIS — L03119 Cellulitis of unspecified part of limb: Secondary | ICD-10-CM

## 2019-01-04 ENCOUNTER — Encounter: Payer: Self-pay | Admitting: Family

## 2019-01-04 NOTE — Progress Notes (Signed)
Post-Op Visit Note   Patient: Ian HammedBrent J Bachar           Date of Birth: 06/19/1971           MRN: [id number] Visit Date: 01/03/2019 PCP: Bing NeighborsHarris, Kimberly S, FNP  Chief Complaint:  Chief Complaint  Patient presents with  . Right Foot - Follow-up    09/30/18 right foot I&D    HPI:  HPI The patient is a 48 year old gentleman who presents today status post right foot irrigation and debridement February of this year. 09/30/18.  He is in regular shoewear has been full weightbearing in medical compression socks he does have some scabbing his incision areas that are draining.  He feels well wondering what precautions she should take when he goes to the beach this summer. Ortho Exam On examination of the right foot.  He has a scar along the medial great toe this is healing well there is some eschar a distance of about 25 mm.  There is no surrounding erythema no drainage moderate swelling to the great toe overall no sign of infection.  Does have 1 plantar ulcer this is also covered in eschar is about 4 mm in diameter.  Was part of the 10 blade knife back to viable tissue there is no draining.  1 mm of depth.  This does not probe.  Overall well appearing surgical site.  Visit Diagnoses:  1. Cellulitis and abscess of foot     Plan: Discussed safety precautions including wearing shoe wear on the beach.  No going barefoot.  Does have neuropathy.  Follow-up 1 more time in about a month anticipate releasing him at that point.  He will continue to monitor his incision closely continue with daily foot checks.  Follow-Up Instructions: Return in about 4 weeks (around 01/31/2019).   Imaging: No results found.  Orders:  No orders of the defined types were placed in this encounter.  No orders of the defined types were placed in this encounter.    PMFS History: Patient Active Problem List   Diagnosis Date Noted  . Diabetes mellitus (HCC) 12/01/2018  . Insomnia 12/01/2018  . Chronic gout without  tophus 12/01/2018  . Diabetic foot infection (HCC)   . Cellulitis of right foot   . Diabetic polyneuropathy associated with type 2 diabetes mellitus (HCC)   . Cellulitis and abscess of foot 09/27/2018  . Abscess of lower back 08/15/2015  . 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 Diabetes mellitus (HCC)     Family History  Problem Relation Age of Onset  . Hypertension Mother   . Hypertension Father   . Aneurysm Father   . Breast cancer Maternal Grandmother   . Alcohol abuse Neg Hx   . Cancer Neg Hx   . COPD Neg Hx   . Diabetes Neg Hx   . Early death Neg Hx   . Drug abuse Neg Hx   . Hearing loss Neg Hx   . Heart disease Neg Hx   . Hyperlipidemia Neg Hx   . Kidney disease Neg Hx   . Stroke Neg Hx     Past Surgical History:  Procedure Laterality Date  . HERNIA REPAIR  1995  . I&D EXTREMITY Right 09/30/2018   Procedure: IRRIGATION AND DEBRIDEMENT R foot abscess;  Surgeon: Nadara Mustarduda, Marcus V, MD;  [facility];  Service: Orthopedics;  Laterality: Right;  . TONSILLECTOMY  2004   Social History  Occupational History  . Occupation: Chef  Tobacco Use  . Smoking status: Never Smoker  . Smokeless tobacco: Never Used  Substance and Sexual Activity  . Alcohol use: Yes    Alcohol/week: 1.0 standard drinks    Types: 1 Glasses of wine per week  . Drug use: No  . Sexual activity: Not Currently

## 2019-01-23 ENCOUNTER — Other Ambulatory Visit: Payer: Self-pay | Admitting: Pharmacist

## 2019-01-23 ENCOUNTER — Other Ambulatory Visit: Payer: Self-pay | Admitting: Family Medicine

## 2019-01-23 MED ORDER — HUMULIN 70/30 KWIKPEN (70-30) 100 UNIT/ML ~~LOC~~ SUPN: 25.0000 [IU] | PEN_INJECTOR | Freq: Two times a day (BID) | SUBCUTANEOUS | 2 refills | Status: DC

## 2019-01-23 MED FILL — HUMULIN 70/30 KWIKPEN: (70-30) 100 | 30 days supply | Qty: 15 | Fill #0

## 2019-01-23 MED FILL — !LINZESS 145 MCG CAPSULE: 145 | 30 days supply | Qty: 30 | Fill #1

## 2019-01-23 MED FILL — traZODone HCL 50 MG TABS: 50 | 30 days supply | Qty: 60 | Fill #1

## 2019-01-23 MED FILL — TADALAFIL (PAH) 20 MG TABS: 20 | 30 days supply | Qty: 5 | Fill #2

## 2019-01-23 MED FILL — metFORMIN HCL 1000 MG TABS: 1000 | 90 days supply | Qty: 180 | Fill #2

## 2019-01-23 MED FILL — GABAPENTIN 300 MG CAPSULE: 300 | 30 days supply | Qty: 60 | Fill #0

## 2019-01-23 NOTE — Telephone Encounter (Signed)
Please advise 

## 2019-02-19 ENCOUNTER — Ambulatory Visit: Payer: Self-pay | Admitting: Family Medicine

## 2019-02-23 ENCOUNTER — Telehealth: Payer: Self-pay

## 2019-02-23 NOTE — Telephone Encounter (Signed)
Called patient to do their pre-visit COVID screening.  Have you been tested for COVID or are you currently waiting for COVID test results? no  Have you recently traveled internationally([place], [place], [place], [place], [place]) or within the [place] to a hotspot area([place], [place], [place], [place], [place])? no  Are you currently experiencing any of the following: fever, cough, SHOB, fatigue, body aches, loss of smell, rash, diarrhea, vomiting, severe headaches, weakness, sore throat? no  Have you been in contact with anyone who has recently travelled? no  Have you been in contact with anyone who is experiencing any of the above symptoms or been diagnosed with COVID  or works in or has recently visited a SNF? no  Asked patient to come in fasting so that we can get fasting labs.

## 2019-02-26 ENCOUNTER — Encounter: Payer: Self-pay | Admitting: Family Medicine

## 2019-02-26 ENCOUNTER — Other Ambulatory Visit: Payer: Self-pay

## 2019-02-26 ENCOUNTER — Ambulatory Visit (INDEPENDENT_AMBULATORY_CARE_PROVIDER_SITE_OTHER): Payer: Self-pay | Admitting: Family Medicine

## 2019-02-26 VITALS — BP 110/75 | HR 64 | Temp 97.2°F | Resp 16 | Ht 71.0 in | Wt 181.0 lb

## 2019-02-26 DIAGNOSIS — N529 Male erectile dysfunction, unspecified: Secondary | ICD-10-CM

## 2019-02-26 DIAGNOSIS — E1159 Type 2 diabetes mellitus with other circulatory complications: Secondary | ICD-10-CM

## 2019-02-26 DIAGNOSIS — K5900 Constipation, unspecified: Secondary | ICD-10-CM

## 2019-02-26 DIAGNOSIS — Z13220 Encounter for screening for lipoid disorders: Secondary | ICD-10-CM

## 2019-02-26 DIAGNOSIS — G4700 Insomnia, unspecified: Secondary | ICD-10-CM

## 2019-02-26 LAB — GLUCOSE, POCT (MANUAL RESULT ENTRY): POC Glucose: 100 mg/dL — AB (ref 70–99)

## 2019-02-26 MED ORDER — LINACLOTIDE 145 MCG PO CAPS: 145.0000 ug | ORAL_CAPSULE | Freq: Every day | ORAL | 1 refills | Status: DC

## 2019-02-26 MED ORDER — TRAZODONE HCL 50 MG PO TABS: 50.0000 mg | ORAL_TABLET | Freq: Every evening | ORAL | 5 refills | Status: DC | PRN

## 2019-02-26 MED ORDER — GABAPENTIN 300 MG PO CAPS: ORAL_CAPSULE | ORAL | 4 refills | Status: DC

## 2019-02-26 MED ORDER — TADALAFIL 20 MG PO TABS: 10.0000 mg | ORAL_TABLET | ORAL | 11 refills | Status: DC | PRN

## 2019-02-26 MED ORDER — HUMULIN 70/30 KWIKPEN (70-30) 100 UNIT/ML ~~LOC~~ SUPN: 12.0000 [IU] | PEN_INJECTOR | Freq: Two times a day (BID) | SUBCUTANEOUS | 2 refills | Status: DC

## 2019-02-26 MED FILL — !LINZESS 145 MCG CAPSULE: 145 | 30 days supply | Qty: 30 | Fill #0

## 2019-02-26 MED FILL — TADALAFIL 20 MG TABS: 20 | 15 days supply | Qty: 5 | Fill #0

## 2019-02-26 MED FILL — traZODone HCL 50 MG TABS: 50 | 30 days supply | Qty: 60 | Fill #0

## 2019-02-26 MED FILL — GABAPENTIN 300 MG CAPSULE: 300 | 30 days supply | Qty: 120 | Fill #0

## 2019-02-26 MED FILL — HUMULIN 70/30 KWIKPEN: (70-30) 100 | 25 days supply | Qty: 6 | Fill #0

## 2019-02-26 NOTE — Progress Notes (Signed)
Established Patient Office Visit  Subjective:  Patient ID: Ian Woodard, male    DOB: 05-Feb-1971  Age: 48 y.o. MRN: [id number]  CC:  Chief Complaint  Patient presents with  . Diabetes    [name] presents for diabetic polyneuropathy and patient with complaint of constipation. He reports that he does have an upcoming appointment this Wednesday at Orthopedics in follow-up of prior surgery on his right foot.  Patient reports that he is not currently on antibiotic therapy for his foot.  He has returned from a recent beach trip.  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  He still has some foot discomfort but states it is greatly improved.       He reports that he has been following a healthy diet in order to keep his blood sugars controlled however he did eat at the [facility] but tried to make healthy choices on his way back from the beach this weekend and the next day his blood sugars were elevated.  He does  monitor his blood sugars regularly and sticks to a healthy diet.  He continues to remain compliant with his medications.  He would like to have his hemoglobin A1c checked at today's visit.  He has also fasting to have his cholesterol checked as well.  He would also like refill of gabapentin as this has helped with his peripheral neuropathy and he would also like a refill of Cialis.       He reports that he is having some issues with constipation as well as large stools.  Patient denies any pain with defecation and no bleeding.  He was given medication by his prior PCP but he reports that he has not been taking this medication.  He admits that on days when he is not working he likely does not drink as much water.  He denies any current issues with abdominal pain.      He is also having some issues with sleep.  He has difficulty falling asleep as well as staying asleep.  He is not sure if this  is related to anxiety.  He reports that his partner is scheduled for upcoming hip replacement surgery and has been stressed out about the surgery therefore stressing the patient out.        Past Medical History:  Diagnosis Date  . Diabetes mellitus ([place])     Past Surgical History:  Procedure Laterality Date  . HERNIA REPAIR  1995  . I&D EXTREMITY Right 09/30/2018   Procedure: IRRIGATION AND DEBRIDEMENT R foot abscess;  Surgeon: Newt Minion, MD;  Location: [place];  Service: Orthopedics;  Laterality: Right;  . TONSILLECTOMY  2004    Family History  Problem Relation Age of Onset  . Hypertension Mother   . Hypertension Father   . Aneurysm Father   . Breast cancer Maternal Grandmother   . Alcohol abuse Neg Hx   . Cancer Neg Hx   . COPD Neg Hx   . Diabetes Neg Hx   . Early death Neg Hx   . Drug abuse Neg Hx   . Hearing loss Neg Hx   . Heart disease Neg Hx   . Hyperlipidemia Neg Hx   . Kidney disease Neg Hx   . Stroke Neg Hx     Social History   Socioeconomic History  . Marital status: Single  Spouse name: Not on file  . Number of children: Not on file  . Years of education: Not on file  . Highest education level: Not on file  Occupational History  . Occupation: Chef  Social Needs  . Financial resource strain: Not on file  . Food insecurity    Worry: Not on file    Inability: Not on file  . Transportation needs    Medical: Not on file    Non-medical: Not on file  Tobacco Use  . Smoking status: Never Smoker  . Smokeless tobacco: Never Used  Substance and Sexual Activity  . Alcohol use: Yes    Alcohol/week: 1.0 standard drinks    Types: 1 Glasses of wine per week  . Drug use: No  . Sexual activity: Not Currently  Lifestyle  . Physical activity    Days per week: Not on file    Minutes per session: Not on file  . Stress: Not on file  Relationships  . Social Herbalist on phone: Not on file    Gets together: Not on file    Attends religious  service: Not on file    Active member of club or organization: Not on file    Attends meetings of clubs or organizations: Not on file    Relationship status: Not on file  . Intimate partner violence    Fear of current or ex partner: Not on file    Emotionally abused: Not on file    Physically abused: Not on file    Forced sexual activity: Not on file  Other Topics Concern  . Not on file  Social History Narrative  . Not on file    Outpatient Medications Prior to Visit  Medication Sig Dispense Refill  . blood glucose meter kit and supplies KIT Dispense based on patient and insurance preference. Use up to four times daily as directed. (FOR ICD-9 250.00, 250.01). 1 each 0  . gabapentin (NEURONTIN) 300 MG capsule TAKE 1 CAPSULE (300 MG TOTAL) BY MOUTH 2 (TWO) TIMES DAILY AS NEEDED. 60 capsule 1  . Insulin Isophane & Regular Human (HUMULIN 70/30 KWIKPEN) (70-30) 100 UNIT/ML PEN Inject 25 Units into the skin 2 (two) times a day. 15 mL 2  . Insulin Syringe-Needle U-100 (ULTICARE INSULIN SYRINGE) 30G X 1/2" 0.5 ML MISC Use to inject insulin daily. 100 each 11  . linaclotide (LINZESS) 145 MCG CAPS capsule Take 1 capsule (145 mcg total) by mouth daily before breakfast. 30 capsule 1  . metFORMIN (GLUCOPHAGE) 1000 MG tablet Take 1 tablet (1,000 mg total) by mouth 2 (two) times daily with a meal. 180 tablet 3  . tadalafil (ADCIRCA/CIALIS) 20 MG tablet Take 0.5-1 tablets (10-20 mg total) by mouth every other day as needed for erectile dysfunction. 5 tablet 11  . traZODone (DESYREL) 50 MG tablet TAKE 1 TABLET (50 MG TOTAL) BY MOUTH AT BEDTIME AS NEEDED AND MAY REPEAT DOSE ONE TIME IF NEEDED FOR SLEEP. 60 tablet 2   No facility-administered medications prior to visit.     Allergies  Allergen Reactions  . Ibuprofen Other (See Comments)    constipation    ROS Review of Systems  Constitutional: Negative for chills, fatigue and fever.  HENT: Negative for sore throat and trouble swallowing.   Eyes:  Negative for photophobia and visual disturbance.  Respiratory: Negative for cough and shortness of breath.   Cardiovascular: Negative for chest pain, palpitations and leg swelling.  Gastrointestinal: Negative for abdominal pain,  constipation, diarrhea and nausea.  Endocrine: Negative for polydipsia, polyphagia and polyuria.  Genitourinary: Negative for dysuria and frequency.  Musculoskeletal: Positive for arthralgias, gait problem and joint swelling. Negative for back pain.  Neurological: Negative for dizziness and headaches.  Hematological: Negative for adenopathy. Does not bruise/bleed easily.  Psychiatric/Behavioral: Positive for sleep disturbance. Negative for self-injury and suicidal ideas.      Objective:    Physical Exam  Constitutional: He is oriented to person, place, and time. He appears well-developed and well-nourished.  Neck: Normal range of motion. Neck supple. No JVD present.  Cardiovascular: Normal rate, regular rhythm and intact distal pulses.  Pulmonary/Chest: Effort normal and breath sounds normal.  Abdominal: Soft. There is no abdominal tenderness. There is no rebound and no guarding.  Musculoskeletal:        General: Tenderness and edema present.     Comments: Right great toe with mild edema as well as some mild tenderness  Lymphadenopathy:    He has no cervical adenopathy.  Neurological: He is alert and oriented to person, place, and time.  Skin: Skin is warm and dry.  Patient with some mild swelling of the right great toe and the right great toe feels warmer than the surrounding toe/foot.  Patient with a healing scar along the right medial foot from prior surgery.  Patient also with a pre-ulcerative callus on the bottom of the left foot below the fourth and fifth toes.  Psychiatric: He has a normal mood and affect. His behavior is normal.  Nursing note and vitals reviewed.   BP 110/75   Pulse 64   Temp (!) 97.2 F (36.2 C) (Temporal)   Resp 16   Ht '5\' 11"'   (1.803 m)   Wt 181 lb (82.1 kg)   SpO2 98%   BMI 25.24 kg/m  Wt Readings from Last 3 Encounters:  02/26/19 181 lb (82.1 kg)  01/03/19 165 lb (74.8 kg)  12/05/18 165 lb (74.8 kg)     Health Maintenance Due  Topic Date Due  . PNEUMOCOCCAL POLYSACCHARIDE VACCINE AGE 88-64 HIGH RISK  01/05/1972  . FOOT EXAM  01/05/1980  . OPHTHALMOLOGY EXAM  01/05/1980  . TETANUS/TDAP  02/21/2019    Lab Results  Component Value Date   TSH 1.330 12/18/2018   Lab Results  Component Value Date   WBC 6.0 12/18/2018   HGB 15.1 12/18/2018   HCT 45.4 12/18/2018   MCV 87 12/18/2018   PLT 389 12/18/2018   Lab Results  Component Value Date   NA 138 12/18/2018   K 4.4 12/18/2018   CO2 18 (L) 12/18/2018   GLUCOSE 101 (H) 12/18/2018   BUN 12 12/18/2018   CREATININE 0.75 (L) 12/18/2018   BILITOT 0.4 12/18/2018   ALKPHOS 98 12/18/2018   AST 17 12/18/2018   ALT 13 12/18/2018   PROT 7.6 12/18/2018   ALBUMIN 4.8 12/18/2018   CALCIUM 9.6 12/18/2018   ANIONGAP 8 10/03/2018   No results found for: CHOL No results found for: HDL No results found for: LDLCALC No results found for: TRIG No results found for: CHOLHDL Lab Results  Component Value Date   HGBA1C 5.8 (H) 12/18/2018      Assessment & Plan:   1. Type 2 diabetes mellitus with other circulatory complication, without long-term current use of insulin (HCC) Patient's fasting glucose at today's visit is 100.  He denies any significant hypoglycemic episodes.  He would like to have hemoglobin A1c checked at today's visit.  I discussed with the  patient that this is generally done every 90 days or greater as this test averages glycosylated hemoglobin over 90-day timeframe.  Patient would like to go ahead and have his hemoglobin A1c done therefore this test was done along with a lipid panel as patient was fasting.  TSH also done due to patient being diabetic as well as with his complaints of constipation.  Patient provided with refill of his current  insulin therapy as well as gabapentin for peripheral neuropathy and refill of Cialis for erectile dysfunction related to his diabetes.  Patient is also being referred to podiatry for ongoing foot care as well as pre-ulcerative callus on the left foot.  Patient is also to show this area to his orthopedic doctor at his upcoming visit.  Discussed with the patient that I have not seen him previously therefore I am not sure to what degree he is having swelling in the right great toe but the right great toe does appear to be swollen and also warm to touch.  Will obtain CBC to look for signs of elevated white blood cell count/infection but patient was told to make sure that he mentions the need to be checked for possible osteomyelitis when he is seen for his orthopedic appointment and I will also send a note to patient's orthopedic doctor and NP regarding his possible osteomyelitis of the right great toe based on today's exam as well as pre-ulcerative callus on the left foot and I discussed with the patient that this actually could be hiding an ulceration underneath.  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 - Glucose (CBG) - Lipid Panel - Hemoglobin A1c - TSH - Insulin Isophane & Regular Human (HUMULIN 70/30 KWIKPEN) (70-30) 100 UNIT/ML PEN; Inject 12 Units into the skin 2 (two) times a day.  Dispense: 15 mL; Refill: 2 - gabapentin (NEURONTIN) 300 MG capsule; Take one pill once or twice in the daytime and two at night  Dispense: 120 capsule; Refill: 4 - tadalafil (CIALIS) 20 MG tablet; Take 0.5-1 tablets (10-20 mg total) by mouth every other day as needed for erectile dysfunction.  Dispense: 5 tablet; Refill: 11 - Ambulatory referral to Podiatry - CBC with Differential  2. Lipid screening Patient will have lipid panel as screening for lipid disorder and also due to his increased risk of CAD - Lipid  Panel  3. Constipation, unspecified constipation type Discussed increase fluids and increase fresh vegetables which are high in fiber.  Patient will have TSH to look for possible hypothyroidism as a cause of or contributing factor to his constipation.  Refill provided of Linzess which patient was prescribed per his PCP.  Discussed the need for colonoscopy if his symptoms do not resolve. - TSH - linaclotide (LINZESS) 145 MCG CAPS capsule; Take 1 capsule (145 mcg total) by mouth daily before breakfast.  Dispense: 30 capsule; Refill: 1  4. Erectile dysfunction of organic origin Refill of Cialis for treatment of erectile dysfunction - tadalafil (CIALIS) 20 MG tablet; Take 0.5-1 tablets (10-20 mg total) by mouth every other day as needed for erectile dysfunction.  Dispense: 5 tablet; Refill: 11  5. Insomnia, unspecified type Prescription for trazodone to take one half or 1 pill at bedtime as needed to help with insomnia.  Sleep hygiene reviewed. - traZODone (DESYREL) 50 MG tablet; Take 1 tablet (50 mg total) by mouth at bedtime as needed and may repeat  dose one time if needed for sleep.  Dispense: 60 tablet; Refill: 5   Follow-up: Return in about 3 months (around 05/29/2019). Sooner if needed; Keep Ortho follow-up   Antony Blackbird, MD

## 2019-02-27 LAB — CBC WITH DIFFERENTIAL/PLATELET
Basophils Absolute: 0.1 x10E3/uL (ref 0.0–0.2)
Basos: 1 %
EOS (ABSOLUTE): 0.4 x10E3/uL (ref 0.0–0.4)
Eos: 6 %
Hematocrit: 48 % (ref 37.5–51.0)
Hemoglobin: 16 g/dL (ref 13.0–17.7)
Immature Grans (Abs): 0 x10E3/uL (ref 0.0–0.1)
Immature Granulocytes: 0 %
Lymphocytes Absolute: 2.4 x10E3/uL (ref 0.7–3.1)
Lymphs: 34 %
MCH: 27.9 pg (ref 26.6–33.0)
MCHC: 33.3 g/dL (ref 31.5–35.7)
MCV: 84 fL (ref 79–97)
Monocytes Absolute: 0.5 x10E3/uL (ref 0.1–0.9)
Monocytes: 7 %
Neutrophils Absolute: 3.7 x10E3/uL (ref 1.4–7.0)
Neutrophils: 52 %
Platelets: 353 x10E3/uL (ref 150–450)
RBC: 5.74 x10E6/uL (ref 4.14–5.80)
RDW: 13.1 % (ref 11.6–15.4)
WBC: 7.1 x10E3/uL (ref 3.4–10.8)

## 2019-02-27 LAB — HEMOGLOBIN A1C
Est. average glucose Bld gHb Est-mCnc: 117 mg/dL
Hgb A1c MFr Bld: 5.7 % — ABNORMAL HIGH (ref 4.8–5.6)

## 2019-02-27 LAB — LIPID PANEL
Chol/HDL Ratio: 4.1 ratio (ref 0.0–5.0)
Cholesterol, Total: 179 mg/dL (ref 100–199)
HDL: 44 mg/dL
LDL Calculated: 121 mg/dL — ABNORMAL HIGH (ref 0–99)
Triglycerides: 72 mg/dL (ref 0–149)
VLDL Cholesterol Cal: 14 mg/dL (ref 5–40)

## 2019-02-27 LAB — TSH: TSH: 1.14 u[IU]/mL (ref 0.450–4.500)

## 2019-02-28 ENCOUNTER — Other Ambulatory Visit: Payer: Self-pay

## 2019-02-28 ENCOUNTER — Ambulatory Visit (INDEPENDENT_AMBULATORY_CARE_PROVIDER_SITE_OTHER): Payer: Self-pay | Admitting: Family

## 2019-02-28 ENCOUNTER — Ambulatory Visit (INDEPENDENT_AMBULATORY_CARE_PROVIDER_SITE_OTHER): Payer: Self-pay

## 2019-02-28 ENCOUNTER — Encounter: Payer: Self-pay | Admitting: Family

## 2019-02-28 DIAGNOSIS — M79674 Pain in right toe(s): Secondary | ICD-10-CM

## 2019-02-28 MED ORDER — DOXYCYCLINE HYCLATE 100 MG PO TABS: 100.0000 mg | ORAL_TABLET | Freq: Two times a day (BID) | ORAL | 0 refills | Status: DC

## 2019-02-28 MED FILL — DOXYCYCLINE HYCLATE 100 MG: 100 | 30 days supply | Qty: 60 | Fill #0

## 2019-02-28 NOTE — Progress Notes (Signed)
Post-Op Visit Note   Patient: Ian HammedBrent J Sherrin           Date of Birth: 03/28/1971           MRN: [id number] Visit Date: 02/28/2019 PCP: Bing NeighborsHarris, Kimberly S, FNP  Chief Complaint:  Chief Complaint  Patient presents with  . Right Foot - Follow-up    09/30/18 right foot I&D    HPI:  HPI The patient is a 48 year old gentleman who presents today for concern of warmth and swelling over his right great toe.  He is status post I&D for abscess back in February of this year.  Initial radiographs and MRI were reassuring and showed no sign of osteomyelitis back on February 28.  These were reviewed today.  Unfortunately he continues to have swelling and mild warmth concerned that he may continue to have infection.  Does also have an ulcer beneath the fifth metatarsal head on the left this has been ongoing for quite some time with thickened callus states initial callus and wound began over a year ago.  No drainage  Denies any fevers or chills.  Ortho Exam On examination of the right foot there is a well-healed surgical scar medially there is no tenderness along this he does however have mild warmth and moderate swelling of the great toe.  There is no ulcerative area no drainage no callus no impending breakdown  Of the left foot he has a plantar ulcer beneath the fifth metatarsal head surrounding callus is 1 cm in diameter after informed consent this was parted with a 10 blade knife back to viable tissue there is no open draining ulcer.  No drainage no surrounding erythema no maceration no sign of infection  Visit Diagnoses:  1. Great toe pain, right     Plan: Unfortunately radiographs of the right foot are concerning for osteomyelitis in the proximal phalanx of the great toe discussed this finding with the patient.  Will place him on doxycycline he would like to undergo conservative measures consider his options he will follow-up in the office in 2 weeks and discuss this with Dr. Lajoyce Cornersuda.  Discussed  return precautions with the patient he will continue with his compression garments we also discussed ongoing care of the left foot he will work on heel cord stretching and will likely need to undergo serial debridement of callus. Follow-Up Instructions: No follow-ups on file.   Imaging: No results found.  Orders:  Orders Placed This Encounter  Procedures  . XR Toe Great Right   Meds ordered this encounter  Medications  . doxycycline (VIBRA-TABS) 100 MG tablet    Sig: Take 1 tablet (100 mg total) by mouth 2 (two) times daily.    Dispense:  60 tablet    Refill:  0     PMFS History: Patient Active Problem List   Diagnosis Date Noted  . Great toe pain, right 02/28/2019  . Diabetes mellitus (HCC) 12/01/2018  . Insomnia 12/01/2018  . Chronic gout without tophus 12/01/2018  . Diabetic foot infection (HCC)   . Cellulitis of right foot   . Diabetic polyneuropathy associated with type 2 diabetes mellitus (HCC)   . Cellulitis and abscess of foot 09/27/2018  . Abscess of lower back 08/15/2015  . 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 Diabetes mellitus (HCC)     Family History  Problem Relation Age of Onset  . Hypertension Mother   . Hypertension Father   .  Aneurysm Father   . Breast cancer Maternal Grandmother   . Alcohol abuse Neg Hx   . Cancer Neg Hx   . COPD Neg Hx   . Diabetes Neg Hx   . Early death Neg Hx   . Drug abuse Neg Hx   . Hearing loss Neg Hx   . Heart disease Neg Hx   . Hyperlipidemia Neg Hx   . Kidney disease Neg Hx   . Stroke Neg Hx     Past Surgical History:  Procedure Laterality Date  . HERNIA REPAIR  1995  . I&D EXTREMITY Right 09/30/2018   Procedure: IRRIGATION AND DEBRIDEMENT R foot abscess;  Surgeon: Newt Minion, MD;  Location: [facility];  Service: Orthopedics;  Laterality: Right;  . TONSILLECTOMY  2004   Social History   Occupational History  . Occupation: Chef  Tobacco Use  . Smoking  status: Never Smoker  . Smokeless tobacco: Never Used  Substance and Sexual Activity  . Alcohol use: Yes    Alcohol/week: 1.0 standard drinks    Types: 1 Glasses of wine per week  . Drug use: No  . Sexual activity: Not Currently

## 2019-03-01 ENCOUNTER — Emergency Department: Admission: EM | Admit: 2019-03-01 | Discharge: 2019-03-01 | Discharge status: Absent without leave | Payer: Self-pay

## 2019-03-06 ENCOUNTER — Other Ambulatory Visit: Payer: Self-pay | Admitting: Family Medicine

## 2019-03-06 DIAGNOSIS — Z79899 Other long term (current) drug therapy: Secondary | ICD-10-CM

## 2019-03-06 DIAGNOSIS — E785 Hyperlipidemia, unspecified: Secondary | ICD-10-CM

## 2019-03-06 DIAGNOSIS — E1169 Type 2 diabetes mellitus with other specified complication: Secondary | ICD-10-CM

## 2019-03-06 MED ORDER — ROSUVASTATIN CALCIUM 10 MG PO TABS: 10.0000 mg | ORAL_TABLET | Freq: Every day | ORAL | 3 refills | Status: DC

## 2019-03-06 NOTE — Progress Notes (Signed)
Patient ID: Ian Woodard, male   DOB: 1971/03/10, 48 y.o.   MRN: [id number]   Patient with recent blood work with LDL of 121 but because he is diabetic, his goal LDL is 70 or less to help reduce the risk of cardiovascular disease.  A prescription for Crestor will be sent to patient's lab.  Order placed for complete metabolic panel in 6 weeks after start of cholesterol medication.

## 2019-03-09 ENCOUNTER — Telehealth: Payer: Self-pay | Admitting: Family Medicine

## 2019-03-09 NOTE — Telephone Encounter (Signed)
Pt would like to know his lab results, pt stated that he's at work so he more than likely wont be able to answer the phone, but stated it's ok to leave a detailed message at [phone].Marland Kitchenplease follow up

## 2019-03-09 NOTE — Telephone Encounter (Signed)
LMOM with detailed lab results per request.

## 2019-03-15 ENCOUNTER — Ambulatory Visit (INDEPENDENT_AMBULATORY_CARE_PROVIDER_SITE_OTHER): Payer: Self-pay | Admitting: Orthopedic Surgery

## 2019-03-15 ENCOUNTER — Encounter: Payer: Self-pay | Admitting: Orthopedic Surgery

## 2019-03-15 ENCOUNTER — Other Ambulatory Visit: Payer: Self-pay

## 2019-03-15 VITALS — Ht 71.0 in | Wt 181.0 lb

## 2019-03-15 DIAGNOSIS — L03119 Cellulitis of unspecified part of limb: Secondary | ICD-10-CM

## 2019-03-15 DIAGNOSIS — L02619 Cutaneous abscess of unspecified foot: Secondary | ICD-10-CM

## 2019-03-20 MED FILL — ROSUVASTATIN CALCIUM 10 MG: 10 | 30 days supply | Qty: 30 | Fill #0

## 2019-03-21 ENCOUNTER — Encounter: Payer: Self-pay | Admitting: Orthopedic Surgery

## 2019-03-21 NOTE — Progress Notes (Signed)
Office Visit Note   Patient: Ian Woodard           Date of Birth: [birth year]           MRN: [id number] Visit Date: 03/15/2019              Requested by: Scot Jun, [place],  [place] [address] PCP: Scot Jun, FNP  Chief Complaint  Patient presents with  . Right Foot - Routine Post Op, Follow-up      HPI: Patient is a 48 year old gentleman who is 6 months status post irrigation debridement right foot abscess.  Patient is currently on doxycycline he denies any warmth or cellulitis states he is concerned about infection.  Assessment & Plan: Visit Diagnoses:  1. Cellulitis and abscess of foot     Plan: Patient will continue to minimize weightbearing complete his course of doxycycline.  Clinically this seems to be more dependent edema and not cellulitis.  Follow-Up Instructions: Return in about 3 weeks (around 04/05/2019).   Ortho Exam  Patient is alert, oriented, no adenopathy, well-dressed, normal affect, normal respiratory effort. Examination patient states he has less swelling and redness in the morning that he does the end of the day, he has a strong dorsalis pedis pulse there is no ulcers, radiographs shows no evidence of osteomyelitis.  Imaging: No results found. No images are attached to the encounter.  Labs: Lab Results  Component Value Date   HGBA1C 5.7 (H) 02/26/2019   HGBA1C 5.8 (H) 12/18/2018   HGBA1C 12.7 (H) 09/29/2018   LABURIC 4.4 10/09/2018   LABURIC 2.7 (L) 09/28/2018   REPTSTATUS 10/05/2018 FINAL 09/30/2018   GRAMSTAIN  09/30/2018    RARE WBC PRESENT, PREDOMINANTLY PMN FEW GRAM POSITIVE COCCI IN PAIRS IN CHAINS    CULT  09/30/2018    ABUNDANT STREPTOCOCCUS AGALACTIAE TESTING AGAINST S. AGALACTIAE NOT ROUTINELY PERFORMED DUE TO PREDICTABILITY OF AMP/PEN/VAN SUSCEPTIBILITY. FEW STAPHYLOCOCCUS AUREUS NO ANAEROBES ISOLATED Performed at [facility], [place] [address], [place],  [address]    LABORGA STAPHYLOCOCCUS AUREUS 09/30/2018     Lab Results  Component Value Date   ALBUMIN 4.8 12/18/2018   ALBUMIN 3.5 09/27/2018   LABURIC 4.4 10/09/2018   LABURIC 2.7 (L) 09/28/2018    Lab Results  Component Value Date   MG 2.0 10/02/2018   MG 1.9 10/01/2018   No results found for: VD25OH  No results found for: PREALBUMIN CBC EXTENDED Latest Ref Rng & Units 02/26/2019 12/18/2018 10/02/2018  WBC 3.4 - 10.8 x10E3/uL 7.1 6.0 11.1(H)  RBC 4.14 - 5.80 x10E6/uL 5.74 5.22 4.34  HGB 13.0 - 17.7 g/dL 16.0 15.1 12.5(L)  HCT 37.5 - 51.0 % 48.0 45.4 36.0(L)  PLT 150 - 450 x10E3/uL 353 389 484(H)  NEUTROABS 1.4 - 7.0 x10E3/uL 3.7 2.9 6.9  LYMPHSABS 0.7 - 3.1 x10E3/uL 2.4 2.3 2.3     Body mass index is 25.24 kg/m.  Orders:  No orders of the defined types were placed in this encounter.  No orders of the defined types were placed in this encounter.    Procedures: No procedures performed  Clinical Data: No additional findings.  ROS:  All other systems negative, except as noted in the HPI. Review of Systems  Objective: Vital Signs: Ht 5\' 11"  (1.803 m)   Wt 181 lb (82.1 kg)   BMI 25.24 kg/m   Specialty Comments:  No specialty comments available.  PMFS History: Patient Active Problem List  Diagnosis Date Noted  . Great toe pain, right 02/28/2019  . Diabetes mellitus (HCC) 12/01/2018  . Insomnia 12/01/2018  . Chronic gout without tophus 12/01/2018  . Diabetic foot infection (HCC)   . Cellulitis of right foot   . Diabetic polyneuropathy associated with type 2 diabetes mellitus (HCC)   . Cellulitis and abscess of foot 09/27/2018  . Abscess of lower back 08/15/2015  . 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 Diabetes mellitus (HCC)     Family History  Problem Relation Age of Onset  . Hypertension Mother   . Hypertension Father   . Aneurysm Father   . Breast cancer Maternal Grandmother   . Alcohol abuse Neg  Hx   . Cancer Neg Hx   . COPD Neg Hx   . Diabetes Neg Hx   . Early death Neg Hx   . Drug abuse Neg Hx   . Hearing loss Neg Hx   . Heart disease Neg Hx   . Hyperlipidemia Neg Hx   . Kidney disease Neg Hx   . Stroke Neg Hx     Past Surgical History:  Procedure Laterality Date  . HERNIA REPAIR  1995  . I&D EXTREMITY Right 09/30/2018   Procedure: IRRIGATION AND DEBRIDEMENT R foot abscess;  Surgeon: Nadara Mustarduda, Yena Tisby V, MD;  [facility];  Service: Orthopedics;  Laterality: Right;  . TONSILLECTOMY  2004   Social History   Occupational History  . Occupation: Chef  Tobacco Use  . Smoking status: Never Smoker  . Smokeless tobacco: Never Used  Substance and Sexual Activity  . Alcohol use: Yes    Alcohol/week: 1.0 standard drinks    Types: 1 Glasses of wine per week  . Drug use: No  . Sexual activity: Not Currently

## 2019-03-23 ENCOUNTER — Other Ambulatory Visit: Payer: Self-pay

## 2019-03-23 ENCOUNTER — Encounter: Payer: Self-pay | Admitting: Podiatry

## 2019-03-23 ENCOUNTER — Ambulatory Visit (INDEPENDENT_AMBULATORY_CARE_PROVIDER_SITE_OTHER): Payer: Self-pay | Admitting: Podiatry

## 2019-03-23 DIAGNOSIS — E1142 Type 2 diabetes mellitus with diabetic polyneuropathy: Secondary | ICD-10-CM

## 2019-03-23 DIAGNOSIS — E1140 Type 2 diabetes mellitus with diabetic neuropathy, unspecified: Secondary | ICD-10-CM | POA: Insufficient documentation

## 2019-03-23 NOTE — Progress Notes (Signed)
This patient presents to the office with chief complaint  diabetic feet.  This patient  says there  is  no pain and discomfort in their feet aside from neuropathy..  This patient says he has history of foot infection which was surgically treated by Dr.  Sharol Given.  He is presently under treatment by Dr.  Sharol Given still.  . This patient presents  to the office today for  a foot evaluation due to history of  diabetes.  General Appearance  Alert, conversant and in no acute stress.  Vascular  Dorsalis pedis and posterior tibial  pulses are palpable  bilaterally.  Capillary return is within normal limits  bilaterally. Temperature is within normal limits  bilaterally.  Neurologic  Senn-Weinstein monofilament wire test  Diminished/absent  bilaterally. Muscle power within normal limits bilaterally.  Nails Thick disfigured discolored nails with subungual debris  from hallux to fifth toes bilaterally.  Pincer nails  No evidence of bacterial infection or drainage bilaterally.  Orthopedic  No limitations of motion of motion feet .  No crepitus or effusions noted.  HAV 1st MPJ  B/L.  Healing foot surgery 1st MPJ right foot.  Skin  normotropic skin with no porokeratosis noted bilaterally.  No signs of infections or ulcers noted.     Onychomycosis  Diabetes with neuropathy.  IE  A diabetic foot exam was performed and there is no evidence of any vascular  pathology.   Neuropathy noted  B/L.  RTC 1 year.   Gardiner Barefoot DPM

## 2019-03-27 MED FILL — GABAPENTIN 300 MG CAPSULE: 300 | 30 days supply | Qty: 120 | Fill #1

## 2019-03-27 MED FILL — TADALAFIL 20 MG TABS: 20 | 15 days supply | Qty: 5 | Fill #1

## 2019-03-27 MED FILL — !LINZESS 145 MCG CAPSULE: 145 | 29 days supply | Qty: 29 | Fill #1

## 2019-03-27 MED FILL — HUMULIN 70/30 KWIKPEN: (70-30) 100 | 25 days supply | Qty: 6 | Fill #1

## 2019-03-27 MED FILL — traZODone HCL 50 MG TABS: 50 | 30 days supply | Qty: 60 | Fill #2

## 2019-04-05 ENCOUNTER — Ambulatory Visit: Payer: Self-pay | Admitting: Orthopedic Surgery

## 2019-04-16 ENCOUNTER — Other Ambulatory Visit: Payer: Self-pay | Admitting: *Deleted

## 2019-04-16 DIAGNOSIS — Z20822 Contact with and (suspected) exposure to covid-19: Secondary | ICD-10-CM

## 2019-04-18 ENCOUNTER — Other Ambulatory Visit: Payer: Self-pay

## 2019-04-18 DIAGNOSIS — Z20822 Contact with and (suspected) exposure to covid-19: Secondary | ICD-10-CM

## 2019-04-18 LAB — NOVEL CORONAVIRUS, NAA: SARS-CoV-2, NAA: DETECTED — AB

## 2019-04-21 LAB — NOVEL CORONAVIRUS, NAA: SARS-CoV-2, NAA: DETECTED — AB

## 2019-04-23 ENCOUNTER — Ambulatory Visit: Payer: Self-pay | Admitting: Orthopedic Surgery

## 2019-05-04 ENCOUNTER — Other Ambulatory Visit: Payer: Self-pay

## 2019-05-04 ENCOUNTER — Other Ambulatory Visit: Payer: Self-pay | Admitting: Family Medicine

## 2019-05-04 DIAGNOSIS — E1159 Type 2 diabetes mellitus with other circulatory complications: Secondary | ICD-10-CM

## 2019-05-04 DIAGNOSIS — K5900 Constipation, unspecified: Secondary | ICD-10-CM

## 2019-05-04 MED ORDER — HUMULIN 70/30 KWIKPEN (70-30) 100 UNIT/ML ~~LOC~~ SUPN: 12.0000 [IU] | PEN_INJECTOR | Freq: Two times a day (BID) | SUBCUTANEOUS | 2 refills | Status: DC

## 2019-05-04 MED FILL — HUMULIN 70/30 KWIKPEN: (70-30) 100 | 25 days supply | Qty: 6 | Fill #2

## 2019-05-04 MED FILL — $LINZESS 145MCG CAPSULE: 145 | 90 days supply | Qty: 90 | Fill #0

## 2019-05-04 MED FILL — TADALAFIL 20 MG TABS: 20 | 15 days supply | Qty: 5 | Fill #2

## 2019-05-04 MED FILL — metFORMIN HCL 1000 MG TABS: 1000 | 90 days supply | Qty: 180 | Fill #3

## 2019-05-04 MED FILL — GABAPENTIN 300 MG CAPSULE: 300 | 30 days supply | Qty: 120 | Fill #2

## 2019-05-04 MED FILL — ROSUVASTATIN CALCIUM 10 MG: 10 | 30 days supply | Qty: 30 | Fill #1

## 2019-05-04 NOTE — Telephone Encounter (Signed)
Sent script for Humulin 70/30 Kwikpen to [facility] for pt's PASS enrollment.Order identical to app approved by Dr. Chapman Fitch on 04/18/19

## 2019-05-10 ENCOUNTER — Other Ambulatory Visit: Payer: Self-pay

## 2019-05-10 ENCOUNTER — Ambulatory Visit (INDEPENDENT_AMBULATORY_CARE_PROVIDER_SITE_OTHER): Payer: Self-pay | Admitting: Orthopedic Surgery

## 2019-05-10 DIAGNOSIS — L02619 Cutaneous abscess of unspecified foot: Secondary | ICD-10-CM

## 2019-05-10 DIAGNOSIS — L03119 Cellulitis of unspecified part of limb: Secondary | ICD-10-CM

## 2019-05-15 ENCOUNTER — Encounter: Payer: Self-pay | Admitting: Orthopedic Surgery
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
## 2019-05-30 ENCOUNTER — Ambulatory Visit: Payer: Self-pay

## 2019-05-31 MED FILL — TADALAFIL 20 MG TABS: 20 | 15 days supply | Qty: 5 | Fill #3

## 2019-05-31 MED FILL — ROSUVASTATIN CALCIUM 10 MG: 10 | 30 days supply | Qty: 30 | Fill #2

## 2019-06-07 ENCOUNTER — Ambulatory Visit: Payer: Self-pay | Admitting: Orthopedic Surgery

## 2019-06-11 ENCOUNTER — Ambulatory Visit (INDEPENDENT_AMBULATORY_CARE_PROVIDER_SITE_OTHER): Payer: Self-pay | Admitting: Orthopedic Surgery

## 2019-06-11 ENCOUNTER — Encounter: Payer: Self-pay | Admitting: Orthopedic Surgery

## 2019-06-11 VITALS — Ht 71.0 in | Wt 181.0 lb

## 2019-06-11 DIAGNOSIS — E1142 Type 2 diabetes mellitus with diabetic polyneuropathy: Secondary | ICD-10-CM

## 2019-06-11 DIAGNOSIS — L97512 Non-pressure chronic ulcer of other part of right foot with fat layer exposed: Secondary | ICD-10-CM

## 2019-06-12 ENCOUNTER — Encounter: Payer: Self-pay | Admitting: Orthopedic Surgery

## 2019-06-12 NOTE — Progress Notes (Signed)
Office Visit Note   Patient: Ian Woodard           Date of Birth: [birth year]           MRN: [id number] Visit Date: 06/11/2019              Requested by: Scot Jun, [place],  [place] [address] PCP: Scot Jun, FNP  Chief Complaint  Patient presents with  . Right Foot - Follow-up    09/30/18 I&D right foot abscess      HPI: Patient is a 48 year old gentleman who is seen about 8 weeks status post irrigation debridement of a right foot abscess.  Patient states that he has a ulcer on the plantar aspect of the right great toe which is different than his initial infection.  Assessment & Plan: Visit Diagnoses:  1. Ulcer of toe of right foot, with fat layer exposed ([place])   2. Type 2 diabetes mellitus with diabetic polyneuropathy, without long-term current use of insulin (HCC)     Plan: Ulcer was debrided of skin and soft tissue no signs of infection.  Continue protective shoe wear.  Follow-up in 2 months.  Follow-Up Instructions: Return in about 2 months (around 08/11/2019).   Ortho Exam  Patient is alert, oriented, no adenopathy, well-dressed, normal affect, normal respiratory effort. Examination patient has an ulcer on the plantar aspect of the right great toe.  The surgical incision is well-healed there is no redness no cellulitis no signs of infection.  After informed consent a 10 blade knife was used to debride the skin and soft tissue back to healthy viable granulation tissue the ulcer is 2 cm in diameter 1 mm deep no signs of infection no exposed bone or tendon.  Imaging: No results found. No images are attached to the encounter.  Labs: Lab Results  Component Value Date   HGBA1C 5.7 (H) 02/26/2019   HGBA1C 5.8 (H) 12/18/2018   HGBA1C 12.7 (H) 09/29/2018   LABURIC 4.4 10/09/2018   LABURIC 2.7 (L) 09/28/2018   REPTSTATUS 10/05/2018 FINAL 09/30/2018   GRAMSTAIN  09/30/2018    RARE WBC PRESENT, PREDOMINANTLY PMN FEW GRAM  POSITIVE COCCI IN PAIRS IN CHAINS    CULT  09/30/2018    ABUNDANT STREPTOCOCCUS AGALACTIAE TESTING AGAINST S. AGALACTIAE NOT ROUTINELY PERFORMED DUE TO PREDICTABILITY OF AMP/PEN/VAN SUSCEPTIBILITY. FEW STAPHYLOCOCCUS AUREUS NO ANAEROBES ISOLATED Performed at [facility], [place] [address] [place], [place] [address]    LABORGA STAPHYLOCOCCUS AUREUS 09/30/2018     Lab Results  Component Value Date   ALBUMIN 4.8 12/18/2018   ALBUMIN 3.5 09/27/2018   LABURIC 4.4 10/09/2018   LABURIC 2.7 (L) 09/28/2018    Lab Results  Component Value Date   MG 2.0 10/02/2018   MG 1.9 10/01/2018   No results found for: VD25OH  No results found for: PREALBUMIN CBC EXTENDED Latest Ref Rng & Units 02/26/2019 12/18/2018 10/02/2018  WBC 3.4 - 10.8 x10E3/uL 7.1 6.0 11.1(H)  RBC 4.14 - 5.80 x10E6/uL 5.74 5.22 4.34  HGB 13.0 - 17.7 g/dL 16.0 15.1 12.5(L)  HCT 37.5 - 51.0 % 48.0 45.4 36.0(L)  PLT 150 - 450 x10E3/uL 353 389 484(H)  NEUTROABS 1.4 - 7.0 x10E3/uL 3.7 2.9 6.9  LYMPHSABS 0.7 - 3.1 x10E3/uL 2.4 2.3 2.3     Body mass index is 25.24 kg/m.  Orders:  No orders of the defined types were placed in this encounter.  No orders of the defined types  were placed in this encounter.    Procedures: No procedures performed  Clinical Data: No additional findings.  ROS:  All other systems negative, except as noted in the HPI. Review of Systems  Objective: Vital Signs: Ht 5\' 11"  (1.803 m)   Wt 181 lb (82.1 kg)   BMI 25.24 kg/m   Specialty Comments:  No specialty comments available.  PMFS History: Patient Active Problem List   Diagnosis Date Noted  . Diabetic neuropathy (HCC) 03/23/2019  . Great toe pain, right 02/28/2019  . Diabetes mellitus (HCC) 12/01/2018  . Insomnia 12/01/2018  . Chronic gout without tophus 12/01/2018  . Diabetic foot infection (HCC)   . Cellulitis of right foot   . Diabetic polyneuropathy associated with type 2 diabetes mellitus (HCC)   . Cellulitis and  abscess of foot 09/27/2018  . Abscess of lower back 08/15/2015  . 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 Diabetes mellitus (HCC)     Family History  Problem Relation Age of Onset  . Hypertension Mother   . Hypertension Father   . Aneurysm Father   . Breast cancer Maternal Grandmother   . Alcohol abuse Neg Hx   . Cancer Neg Hx   . COPD Neg Hx   . Diabetes Neg Hx   . Early death Neg Hx   . Drug abuse Neg Hx   . Hearing loss Neg Hx   . Heart disease Neg Hx   . Hyperlipidemia Neg Hx   . Kidney disease Neg Hx   . Stroke Neg Hx     Past Surgical History:  Procedure Laterality Date  . HERNIA REPAIR  1995  . I&D EXTREMITY Right 09/30/2018   Procedure: IRRIGATION AND DEBRIDEMENT R foot abscess;  Surgeon: 10/02/2018, MD;  Location: [facility] OR;  Service: Orthopedics;  Laterality: Right;  . TONSILLECTOMY  2004   Social History   Occupational History  . Occupation: Chef  Tobacco Use  . Smoking status: Never Smoker  . Smokeless tobacco: Never Used  Substance and Sexual Activity  . Alcohol use: Yes    Alcohol/week: 1.0 standard drinks    Types: 1 Glasses of wine per week  . Drug use: No  . Sexual activity: Not Currently

## 2019-06-25 ENCOUNTER — Ambulatory Visit: Payer: Self-pay

## 2019-07-05 MED FILL — ROSUVASTATIN CALCIUM 10 MG: 10 | 30 days supply | Qty: 30 | Fill #3

## 2019-07-05 MED FILL — TADALAFIL 20 MG TABS: 20 | 15 days supply | Qty: 5 | Fill #4

## 2019-07-24 MED FILL — ROSUVASTATIN CALCIUM 10 MG: 10 | 30 days supply | Qty: 30 | Fill #4

## 2019-07-24 MED FILL — GABAPENTIN 300 MG CAPSULE: 300 | 30 days supply | Qty: 120 | Fill #3

## 2019-07-24 MED FILL — HUMULIN 70/30 KWIKPEN: (70-30) 100 | 25 days supply | Qty: 6 | Fill #3

## 2019-07-24 MED FILL — TADALAFIL 20 MG TABS: 20 | 15 days supply | Qty: 5 | Fill #5

## 2019-08-06 ENCOUNTER — Encounter: Payer: Self-pay | Admitting: Orthopedic Surgery

## 2019-08-06 ENCOUNTER — Ambulatory Visit (INDEPENDENT_AMBULATORY_CARE_PROVIDER_SITE_OTHER): Payer: Self-pay | Admitting: Orthopedic Surgery

## 2019-08-06 ENCOUNTER — Other Ambulatory Visit: Payer: Self-pay

## 2019-08-06 VITALS — Ht 71.0 in | Wt 181.0 lb

## 2019-08-06 DIAGNOSIS — L02619 Cutaneous abscess of unspecified foot: Secondary | ICD-10-CM

## 2019-08-06 DIAGNOSIS — L03119 Cellulitis of unspecified part of limb: Secondary | ICD-10-CM

## 2019-08-08 ENCOUNTER — Ambulatory Visit: Payer: Self-pay | Admitting: Nurse Practitioner

## 2019-08-14 ENCOUNTER — Encounter: Payer: Self-pay | Admitting: Orthopedic Surgery

## 2019-08-14 MED FILL — metFORMIN HCL 1000 MG TABS: 1000 | 60 days supply | Qty: 120 | Fill #4

## 2019-08-14 MED FILL — NOVOLIN 70/30 FLEXPEN (70-3: (70-30) 100 | 25 days supply | Qty: 6 | Fill #4

## 2019-08-14 NOTE — Progress Notes (Signed)
Office Visit Note   Patient: Ian Woodard           Date of Birth: 13-Aug-1970           MRN: [id number] Visit Date: 08/06/2019              Requested by: Scot Jun, [place] [address] [place],  [place] [address] PCP: Scot Jun, FNP  Chief Complaint  Patient presents with  . Right Foot - Follow-up    09/30/18 I&D right foot abscess       HPI: Patient is a 49 year old gentleman who is approximately 10 months status post irrigation debridement right foot abscess patient feels well without concerns states diabetes is under better control.  Assessment & Plan: Visit Diagnoses:  1. Cellulitis and abscess of foot     Plan: The ulcer beneath the right great toe IP joint was debrided of skin and soft tissue no deep infection.  Plan to follow-up as needed.  Follow-Up Instructions: Return if symptoms worsen or fail to improve.   Ortho Exam  Patient is alert, oriented, no adenopathy, well-dressed, normal affect, normal respiratory effort. Examination patient has dorsiflexion to neutral with a little Achilles contracture there is an ulcer beneath the IP joint of the right great toe plantar aspect.  After informed consent a 10 blade knife was used to breed the skin and soft tissue back to healthy viable tissue he tolerated this well good granulation tissue no exposed bone or tendon.  Imaging: No results found. No images are attached to the encounter.  Labs: Lab Results  Component Value Date   HGBA1C 5.7 (H) 02/26/2019   HGBA1C 5.8 (H) 12/18/2018   HGBA1C 12.7 (H) 09/29/2018   LABURIC 4.4 10/09/2018   LABURIC 2.7 (L) 09/28/2018   REPTSTATUS 10/05/2018 FINAL 09/30/2018   GRAMSTAIN  09/30/2018    RARE WBC PRESENT, PREDOMINANTLY PMN FEW GRAM POSITIVE COCCI IN PAIRS IN CHAINS    CULT  09/30/2018    ABUNDANT STREPTOCOCCUS AGALACTIAE TESTING AGAINST S. AGALACTIAE NOT ROUTINELY PERFORMED DUE TO PREDICTABILITY OF AMP/PEN/VAN SUSCEPTIBILITY. FEW  STAPHYLOCOCCUS AUREUS NO ANAEROBES ISOLATED Performed at [facility], [place] [address], [place], [place] [address]    LABORGA STAPHYLOCOCCUS AUREUS 09/30/2018     Lab Results  Component Value Date   ALBUMIN 4.8 12/18/2018   ALBUMIN 3.5 09/27/2018   LABURIC 4.4 10/09/2018   LABURIC 2.7 (L) 09/28/2018    Lab Results  Component Value Date   MG 2.0 10/02/2018   MG 1.9 10/01/2018   No results found for: VD25OH  No results found for: PREALBUMIN CBC EXTENDED Latest Ref Rng & Units 02/26/2019 12/18/2018 10/02/2018  WBC 3.4 - 10.8 x10E3/uL 7.1 6.0 11.1(H)  RBC 4.14 - 5.80 x10E6/uL 5.74 5.22 4.34  HGB 13.0 - 17.7 g/dL 16.0 15.1 12.5(L)  HCT 37.5 - 51.0 % 48.0 45.4 36.0(L)  PLT 150 - 450 x10E3/uL 353 389 484(H)  NEUTROABS 1.4 - 7.0 x10E3/uL 3.7 2.9 6.9  LYMPHSABS 0.7 - 3.1 x10E3/uL 2.4 2.3 2.3     Body mass index is 25.24 kg/m.  Orders:  No orders of the defined types were placed in this encounter.  No orders of the defined types were placed in this encounter.    Procedures: No procedures performed  Clinical Data: No additional findings.  ROS:  All other systems negative, except as noted in the HPI. Review of Systems  Objective: Vital Signs: Ht 5\' 11"  (1.803 m)   Wt  181 lb (82.1 kg)   BMI 25.24 kg/m   Specialty Comments:  No specialty comments available.  PMFS History: Patient Active Problem List   Diagnosis Date Noted  . Diabetic neuropathy (HCC) 03/23/2019  . Great toe pain, right 02/28/2019  . Diabetes mellitus (HCC) 12/01/2018  . Insomnia 12/01/2018  . Chronic gout without tophus 12/01/2018  . Diabetic foot infection (HCC)   . Cellulitis of right foot   . Diabetic polyneuropathy associated with type 2 diabetes mellitus (HCC)   . Cellulitis and abscess of foot 09/27/2018  . Abscess of lower back 08/15/2015  . 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 Diabetes mellitus (HCC)     Family  History  Problem Relation Age of Onset  . Hypertension Mother   . Hypertension Father   . Aneurysm Father   . Breast cancer Maternal Grandmother   . Alcohol abuse Neg Hx   . Cancer Neg Hx   . COPD Neg Hx   . Diabetes Neg Hx   . Early death Neg Hx   . Drug abuse Neg Hx   . Hearing loss Neg Hx   . Heart disease Neg Hx   . Hyperlipidemia Neg Hx   . Kidney disease Neg Hx   . Stroke Neg Hx     Past Surgical History:  Procedure Laterality Date  . HERNIA REPAIR  1995  . I & D EXTREMITY Right 09/30/2018   Procedure: IRRIGATION AND DEBRIDEMENT R foot abscess;  Surgeon: Nadara Mustard, MD;  Location: [facility] OR;  Service: Orthopedics;  Laterality: Right;  . TONSILLECTOMY  2004   Social History   Occupational History  . Occupation: Chef  Tobacco Use  . Smoking status: Never Smoker  . Smokeless tobacco: Never Used  Substance and Sexual Activity  . Alcohol use: Yes    Alcohol/week: 1.0 standard drinks    Types: 1 Glasses of wine per week  . Drug use: No  . Sexual activity: Not Currently

## 2019-08-16 ENCOUNTER — Other Ambulatory Visit: Payer: Self-pay | Admitting: Family Medicine

## 2019-08-16 DIAGNOSIS — K5900 Constipation, unspecified: Secondary | ICD-10-CM

## 2019-08-23 MED FILL — NOVOLIN 70/30 FLEXPEN (70-3: (70-30) 100 | 25 days supply | Qty: 6 | Fill #4

## 2019-08-28 ENCOUNTER — Telehealth: Payer: Self-pay

## 2019-08-28 NOTE — Telephone Encounter (Signed)
Called patient to do their pre-visit COVID screening.  Call went to voicemail. Unable to do prescreening.  

## 2019-08-29 ENCOUNTER — Ambulatory Visit (INDEPENDENT_AMBULATORY_CARE_PROVIDER_SITE_OTHER): Payer: BC Managed Care – PPO | Admitting: Nurse Practitioner

## 2019-08-29 ENCOUNTER — Other Ambulatory Visit: Payer: Self-pay

## 2019-08-29 VITALS — BP 103/68 | HR 78 | Temp 97.3°F | Resp 17 | Wt 194.0 lb

## 2019-08-29 DIAGNOSIS — Z1211 Encounter for screening for malignant neoplasm of colon: Secondary | ICD-10-CM

## 2019-08-29 DIAGNOSIS — N529 Male erectile dysfunction, unspecified: Secondary | ICD-10-CM

## 2019-08-29 DIAGNOSIS — K5909 Other constipation: Secondary | ICD-10-CM

## 2019-08-29 DIAGNOSIS — E1159 Type 2 diabetes mellitus with other circulatory complications: Secondary | ICD-10-CM | POA: Diagnosis not present

## 2019-08-29 LAB — POCT GLYCOSYLATED HEMOGLOBIN (HGB A1C): Hemoglobin A1C: 5.5 % (ref 4.0–5.6)

## 2019-08-29 MED ORDER — HUMULIN 70/30 KWIKPEN (70-30) 100 UNIT/ML ~~LOC~~ SUPN: 7.0000 [IU] | PEN_INJECTOR | Freq: Two times a day (BID) | SUBCUTANEOUS | 2 refills | Status: DC

## 2019-08-29 MED ORDER — GABAPENTIN 300 MG PO CAPS: 300.0000 mg | ORAL_CAPSULE | Freq: Three times a day (TID) | ORAL | 0 refills | Status: DC

## 2019-08-29 MED FILL — GABAPENTIN 300 MG CAPSULE: 300 | 90 days supply | Qty: 270 | Fill #0

## 2019-08-29 NOTE — Progress Notes (Signed)
Assessment & Plan:  Ian Woodard was seen today for diabetes and medication management.  Diagnoses and all orders for this visit:  Type 2 diabetes mellitus with other circulatory complication, without long-term current use of insulin (HCC) -     HgB A1c -     Insulin Isophane & Regular Human (HUMULIN 70/30 KWIKPEN) (70-30) 100 UNIT/ML PEN; Inject 7 Units into the skin 2 (two) times daily. -     gabapentin (NEURONTIN) 300 MG capsule; Take 1 capsule (300 mg total) by mouth 3 (three) times daily. -     CMP14+EGFR -     Lipid panel -     Ambulatory referral to Ophthalmology Continue blood sugar control as discussed in office today, low carbohydrate diet, and regular physical exercise as tolerated, 150 minutes per week (30 min each day, 5 days per week, or 50 min 3 days per week). Keep blood sugar logs with fasting goal of 90-130 mg/dl, post prandial (after you eat) less than 180.  For Hypoglycemia: BS <60 and Hyperglycemia BS >400; contact the clinic ASAP. Annual eye exams and foot exams are recommended.   Chronic constipation -     Ambulatory referral to Gastroenterology Increase water intake as well as fruits and vegetables.   Colon cancer screening -     Ambulatory referral to Gastroenterology  Erectile dysfunction, unspecified erectile dysfunction type -     Ambulatory referral to Urology    Patient has been counseled on age-appropriate routine health concerns for screening and prevention. These are reviewed and up-to-date. Referrals have been placed accordingly. Immunizations are up-to-date or declined.    Subjective:   Chief Complaint  Patient presents with  . Diabetes  . Medication Management    Linzess isn't preferred by insurance. Linzess also isn't helping.   [name] 49 y.o. male presents to office today for follow up.  DM TYPE 2 Currently well controlled. Hyperglycemic symptoms include peripheral neuropathy for which he takes gabapentin however he has only  been taking gabapentin as needed instead of daily. Reports little improvement in neuropathy symptoms. Will increase gabapentin and have him take daily as prescribed. Fasting 90s. Post prandial 100-105. He has been able to get his novolin 70/30 for much lower cost at [facility] so he has been taking this instead of the humulin 70/30. He is administering 6-7 units twice a day instead of 12 units due to hypoglycemia. Other medications include metformin 1000 mg BID. We discussed taking him off the insulin in a few months based on A1c reading. Will continue to monitor.  Lab Results  Component Value Date   HGBA1C 5.5 08/29/2019    Erectile Dysfunction Has tried cialis and viagra and both have been ineffective. He has difficulty maintaining and achieving an erection. There are no erections with masturbation or upon awakening.    Chronic Constipation Reports Linzess 145 mcg daily is ineffective however if he takes 2 he has explosive diarrhea. As he is due for colonoscopy this year will go ahead and refer to GI. He also has a history of GERD and Dysphagia.   Dyslipidemia LDL at goal of <70. Taking crestor 10 mg daily as prescribed. Denies any statin intolerance or myalgias.  Lab Results  Component Value Date   LDLCALC 51 08/29/2019   Review of Systems  Constitutional: Negative for fever, malaise/fatigue and weight loss.  HENT: Negative.  Negative for nosebleeds.   Eyes: Negative.  Negative for blurred vision, double vision and photophobia.  Respiratory: Negative.  Negative for cough and shortness of breath.   Cardiovascular: Negative.  Negative for chest pain, palpitations and leg swelling.  Gastrointestinal: Positive for heartburn. Negative for nausea and vomiting.  Genitourinary:       ED  Musculoskeletal: Negative.  Negative for myalgias.  Neurological: Negative.  Negative for dizziness, focal weakness, seizures and headaches.  Psychiatric/Behavioral: Negative.  Negative for suicidal ideas.     Past Medical History:  Diagnosis Date  . Diabetes mellitus ([place])     Past Surgical History:  Procedure Laterality Date  . HERNIA REPAIR  1995  . I & D EXTREMITY Right 09/30/2018   Procedure: IRRIGATION AND DEBRIDEMENT R foot abscess;  Surgeon: Newt Minion, MD;  Location: [facility];  Service: Orthopedics;  Laterality: Right;  . TONSILLECTOMY  2004    Family History  Problem Relation Age of Onset  . Hypertension Mother   . Hypertension Father   . Aneurysm Father   . Breast cancer Maternal Grandmother   . Alcohol abuse Neg Hx   . Cancer Neg Hx   . COPD Neg Hx   . Diabetes Neg Hx   . Early death Neg Hx   . Drug abuse Neg Hx   . Hearing loss Neg Hx   . Heart disease Neg Hx   . Hyperlipidemia Neg Hx   . Kidney disease Neg Hx   . Stroke Neg Hx     Social History Reviewed with no changes to be made today.   Outpatient Medications Prior to Visit  Medication Sig Dispense Refill  . Insulin Syringe-Needle U-100 (ULTICARE INSULIN SYRINGE) 30G X 1/2" 0.5 ML MISC Use to inject insulin daily. 100 each 11  . metFORMIN (GLUCOPHAGE) 1000 MG tablet Take 1 tablet (1,000 mg total) by mouth 2 (two) times daily with a meal. 180 tablet 3  . rosuvastatin (CRESTOR) 10 MG tablet Take 1 tablet (10 mg total) by mouth daily. To lower cholesterol 90 tablet 3  . tadalafil (CIALIS) 20 MG tablet Take 0.5-1 tablets (10-20 mg total) by mouth every other day as needed for erectile dysfunction. 5 tablet 11  . gabapentin (NEURONTIN) 300 MG capsule Take one pill once or twice in the daytime and two at night 120 capsule 4  . Insulin Isophane & Regular Human (HUMULIN 70/30 KWIKPEN) (70-30) 100 UNIT/ML PEN Inject 12 Units into the skin 2 (two) times daily. 30 mL 2  . traZODone (DESYREL) 50 MG tablet Take 1 tablet (50 mg total) by mouth at bedtime as needed and may repeat dose one time if needed for sleep. (Patient not taking: Reported on 08/29/2019) 60 tablet 5  . blood glucose meter kit and supplies KIT Dispense  based on patient and insurance preference. Use up to four times daily as directed. (FOR ICD-9 250.00, 250.01). 1 each 0  . linaclotide (LINZESS) 145 MCG CAPS capsule Take 1 capsule (145 mcg total) by mouth daily before breakfast. (Patient not taking: Reported on 08/29/2019) 30 capsule 0   No facility-administered medications prior to visit.    Allergies  Allergen Reactions  . Ibuprofen Other (See Comments)    constipation       Objective:    BP 103/68   Pulse 78   Temp (!) 97.3 F (36.3 C) (Temporal)   Resp 17   Wt 194 lb (88 kg)   SpO2 96%   BMI 27.06 kg/m  Wt Readings from Last 3 Encounters:  08/29/19 194 lb (88 kg)  08/06/19 181 lb (82.1 kg)  06/11/19  181 lb (82.1 kg)    Physical Exam Vitals and nursing note reviewed.  Constitutional:      Appearance: He is well-developed.  HENT:     Head: Normocephalic and atraumatic.  Cardiovascular:     Rate and Rhythm: Normal rate and regular rhythm.     Heart sounds: Normal heart sounds. No murmur. No friction rub. No gallop.   Pulmonary:     Effort: Pulmonary effort is normal. No tachypnea or respiratory distress.     Breath sounds: Normal breath sounds. No decreased breath sounds, wheezing, rhonchi or rales.  Chest:     Chest wall: No tenderness.  Abdominal:     General: Bowel sounds are normal.     Palpations: Abdomen is soft.  Musculoskeletal:        General: Normal range of motion.     Cervical back: Normal range of motion.  Skin:    General: Skin is warm and dry.  Neurological:     Mental Status: He is alert and oriented to person, place, and time.     Coordination: Coordination normal.  Psychiatric:        Behavior: Behavior normal. Behavior is cooperative.        Thought Content: Thought content normal.        Judgment: Judgment normal.          Patient has been counseled extensively about nutrition and exercise as well as the importance of adherence with medications and regular follow-up. The patient  was given clear instructions to go to ER or return to medical center if symptoms don't improve, worsen or new problems develop. The patient verbalized understanding.   Follow-up: Return in about 3 weeks (around 09/19/2019) for neuropathy TELE.   Gildardo Pounds, FNP-BC [facility] [place], [place]   08/30/2019, 9:08 PM

## 2019-08-29 NOTE — Patient Instructions (Addendum)
[email or web address]  [email or web address]

## 2019-08-30 LAB — LIPID PANEL
Chol/HDL Ratio: 2.6 ratio (ref 0.0–5.0)
Cholesterol, Total: 108 mg/dL (ref 100–199)
HDL: 41 mg/dL (ref >39)
LDL Chol Calc (NIH): 51 mg/dL (ref 0–99)
Triglycerides: 78 mg/dL (ref 0–149)
VLDL Cholesterol Cal: 16 mg/dL (ref 5–40)

## 2019-08-30 LAB — CMP14+EGFR
ALT: 19 IU/L (ref 0–44)
AST: 28 IU/L (ref 0–40)
Albumin/Globulin Ratio: 2 (ref 1.2–2.2)
Albumin: 4.5 g/dL (ref 4.0–5.0)
Alkaline Phosphatase: 90 IU/L (ref 39–117)
BUN/Creatinine Ratio: 13 (ref 9–20)
BUN: 12 mg/dL (ref 6–24)
Bilirubin Total: 0.3 mg/dL (ref 0.0–1.2)
CO2: 24 mmol/L (ref 20–29)
Calcium: 9.2 mg/dL (ref 8.7–10.2)
Chloride: 105 mmol/L (ref 96–106)
Creatinine, Ser: 0.92 mg/dL (ref 0.76–1.27)
GFR calc Af Amer: 113 mL/min/{1.73_m2} (ref >59)
GFR calc non Af Amer: 97 mL/min/{1.73_m2} (ref >59)
Globulin, Total: 2.3 g/dL (ref 1.5–4.5)
Glucose: 101 mg/dL — ABNORMAL HIGH (ref 65–99)
Potassium: 5 mmol/L (ref 3.5–5.2)
Sodium: 144 mmol/L (ref 134–144)
Total Protein: 6.8 g/dL (ref 6.0–8.5)

## 2019-09-12 ENCOUNTER — Encounter: Payer: Self-pay | Admitting: Gastroenterology

## 2019-09-19 ENCOUNTER — Encounter: Payer: Self-pay | Admitting: Internal Medicine

## 2019-09-19 ENCOUNTER — Ambulatory Visit (INDEPENDENT_AMBULATORY_CARE_PROVIDER_SITE_OTHER): Payer: BC Managed Care – PPO | Admitting: Internal Medicine

## 2019-09-19 DIAGNOSIS — E1142 Type 2 diabetes mellitus with diabetic polyneuropathy: Secondary | ICD-10-CM | POA: Diagnosis not present

## 2019-09-19 DIAGNOSIS — E1159 Type 2 diabetes mellitus with other circulatory complications: Secondary | ICD-10-CM

## 2019-09-19 DIAGNOSIS — K5909 Other constipation: Secondary | ICD-10-CM | POA: Diagnosis not present

## 2019-09-19 DIAGNOSIS — N529 Male erectile dysfunction, unspecified: Secondary | ICD-10-CM

## 2019-09-19 NOTE — Progress Notes (Signed)
Virtual Visit via Telephone Note  I connected with Ian Woodard, on 09/19/2019 at 3:29 PM by telephone due to the COVID-19 pandemic and verified that I am speaking with the correct person using two identifiers.   Consent: I discussed the limitations, risks, security and privacy concerns of performing an evaluation and management service by telephone and the availability of in person appointments. I also discussed with the patient that there may be a patient responsible charge related to this service. The patient expressed understanding and agreed to proceed.   Location of Patient: Emergency planning/management officer of Provider: Clinic    Persons participating in Telemedicine visit: Reuven Braver [facility] Dr. Juleen China      History of Present Illness: Patient has a PMH of DM and was prescribed Gabapentin for neuropathy. Was previously taking it prn but when saw Geryl Rankins was informed that he needed to take it consistently. He has been taking medication regularly. Neuropathy has improved but not completely. He is taking Gabapentin 300 mg in morning and 600 mg at night. Has had no side effects from the medication.   Patient with a history of chronic constipation. He had a referral for GI placed. He has now scheduled a visit.   Also concerned about his erectile dysfunction. Has not had an erection for >1 year. Had urology referral placed but he has not heard anything about a visit.   Patient has T2DM. Fasting CBGs always <120 and typically range in 90-100s. Taking Insulin 10-12u BID.      Past Medical History:  Diagnosis Date  . Diabetes mellitus (HCC)    Allergies  Allergen Reactions  . Ibuprofen Other (See Comments)    constipation    Current Outpatient Medications on File Prior to Visit  Medication Sig Dispense Refill  . gabapentin (NEURONTIN) 300 MG capsule Take 1 capsule (300 mg total) by mouth 3 (three) times daily. 270 capsule 0  . Insulin Isophane & Regular Human  (HUMULIN 70/30 KWIKPEN) (70-30) 100 UNIT/ML PEN Inject 7 Units into the skin 2 (two) times daily. 30 mL 2  . Insulin Syringe-Needle U-100 (ULTICARE INSULIN SYRINGE) 30G X 1/2" 0.5 ML MISC Use to inject insulin daily. 100 each 11  . metFORMIN (GLUCOPHAGE) 1000 MG tablet Take 1 tablet (1,000 mg total) by mouth 2 (two) times daily with a meal. 180 tablet 3  . rosuvastatin (CRESTOR) 10 MG tablet Take 1 tablet (10 mg total) by mouth daily. To lower cholesterol 90 tablet 3  . tadalafil (CIALIS) 20 MG tablet Take 0.5-1 tablets (10-20 mg total) by mouth every other day as needed for erectile dysfunction. 5 tablet 11  . traZODone (DESYREL) 50 MG tablet Take 1 tablet (50 mg total) by mouth at bedtime as needed and may repeat dose one time if needed for sleep. (Patient not taking: Reported on 09/19/2019) 60 tablet 5   No current facility-administered medications on file prior to visit.    Observations/Objective: NAD. Speaking clearly.  Work of breathing normal.  Alert and oriented. Mood appropriate.   Assessment and Plan: 1. Diabetic polyneuropathy associated with type 2 diabetes mellitus (HCC) No history of renal dysfunction. Discussed that may take up to 2 months of consistent medication usage to see improvement; however, have room to increase dose. Would recommend taking an additional 300 mg in the afternoon along with morning and PM dosage.   2. Chronic constipation Follow up with GI.   3. Erectile dysfunction of organic origin Urology referral was placed but  patient did not hear from [facility]. Laurena Bering, CMA checking on status of referral.   4. Type 2 diabetes mellitus with other circulatory complication, without long-term current use of insulin (HCC) Given CBGs well controlled and last A1c of 5.5 on 1.27, recommended decreasing Insulin to 8u BID. Goal of being able to discontinue Insulin completely. Continue Metformin.  Counseled on Diabetic diet, my plate method, 476 minutes of moderate  intensity exercise/week Blood sugar logs with fasting goals of 80-120 mg/dl, random of less than 546 and in the event of sugars less than 60 mg/dl or greater than 503 mg/dl encouraged to notify the clinic. Advised on the need for annual eye exams, annual foot exams, Pneumonia vaccine.    Follow Up Instructions: Return in about 3 months for chronic medical conditions    I discussed the assessment and treatment plan with the patient. The patient was provided an opportunity to ask questions and all were answered. The patient agreed with the plan and demonstrated an understanding of the instructions.   The patient was advised to call back or seek an in-person evaluation if the symptoms worsen or if the condition fails to improve as anticipated.     I provided 20 minutes total of non-face-to-face time during this encounter including median intraservice time, reviewing previous notes, investigations, ordering medications, medical decision making, coordinating care and patient verbalized understanding at the end of the visit.    Marcy Siren, D.O. Primary [facility] at [facility]  09/19/2019, 3:29 PM

## 2019-09-20 MED ORDER — HUMULIN 70/30 KWIKPEN (70-30) 100 UNIT/ML ~~LOC~~ SUPN: 8.0000 [IU] | PEN_INJECTOR | Freq: Two times a day (BID) | SUBCUTANEOUS | 2 refills | Status: DC

## 2019-09-26 MED FILL — ROSUVASTATIN CALCIUM 10 MG: 10 | 30 days supply | Qty: 30 | Fill #5

## 2019-09-26 MED FILL — TADALAFIL 20 MG TABS: 20 | 15 days supply | Qty: 5 | Fill #6

## 2019-09-26 MED FILL — NOVOLIN 70/30 FLEXPEN (70-3: (70-30) 100 | 25 days supply | Qty: 6 | Fill #5

## 2019-10-10 ENCOUNTER — Encounter: Payer: Self-pay | Admitting: General Practice

## 2019-10-10 DIAGNOSIS — E113292 Type 2 diabetes mellitus with mild nonproliferative diabetic retinopathy without macular edema, left eye: Secondary | ICD-10-CM | POA: Diagnosis not present

## 2019-10-10 DIAGNOSIS — H02834 Dermatochalasis of left upper eyelid: Secondary | ICD-10-CM | POA: Diagnosis not present

## 2019-10-10 DIAGNOSIS — H02831 Dermatochalasis of right upper eyelid: Secondary | ICD-10-CM | POA: Diagnosis not present

## 2019-10-10 DIAGNOSIS — H11823 Conjunctivochalasis, bilateral: Secondary | ICD-10-CM | POA: Diagnosis not present

## 2019-10-10 LAB — HM DIABETES EYE EXAM

## 2019-10-11 ENCOUNTER — Encounter: Payer: Self-pay | Admitting: Gastroenterology

## 2019-10-11 ENCOUNTER — Ambulatory Visit: Payer: BC Managed Care – PPO | Admitting: Gastroenterology

## 2019-10-11 VITALS — BP 116/60 | HR 66 | Temp 97.0°F | Ht 71.0 in | Wt 199.0 lb

## 2019-10-11 DIAGNOSIS — R194 Change in bowel habit: Secondary | ICD-10-CM

## 2019-10-11 DIAGNOSIS — K5900 Constipation, unspecified: Secondary | ICD-10-CM

## 2019-10-11 MED ORDER — POLYETHYLENE GLYCOL 3350 17 G PO PACK: 17.0000 g | PACK | Freq: Every day | ORAL | 0 refills | Status: DC | PRN

## 2019-10-11 MED ORDER — SUPREP BOWEL PREP KIT 17.5-3.13-1.6 GM/177ML PO SOLN: ORAL | 0 refills | Status: DC

## 2019-10-11 MED FILL — SUPREP BOWEL PREP KIT: 17.5-3.13-1 | 1 days supply | Qty: 354 | Fill #0

## 2019-10-11 NOTE — Progress Notes (Signed)
HPI :  49 year old male with a history of GERD status post Nissen in the past, history of diabetes and hyperlipidemia, known to me from prior visit in 2016, here to reestablish with me for bowel habit changes/constipation.  He states at baseline he had very irregular bowel habits for most of his life.  About 2 years ago he has developed constipation.  At one point in time it was so severe that he went 10 days without a bowel movement and led to an emergency room visit.  He drank some bowel preparation at that time to reset his system which worked, however he is continue to have some chronic symptoms.  Stools can be hard to pass and can go a few days without having a bowel movement if he does not take anything.  He has been tried on Linzess about a year ago, he states this caused a lot of urgency and loose watery stools and he did not like the way it made him feel so he did not take it anymore.  He has tried Metamucil in the past which has not helped.  He was on MiraLAX once daily in the past, states he felt the same way as when he was on Linzess, had looser stools with more urgency and stopped it.  Most recently he has been on a stool softener over-the-counter and this generally provide some regularity for him, he has a bowel movement about 5 days a week.  If he does not take it he will have hard stools and makes it much more difficult the past.  He denies any blood in his stools.  No abdominal pains.  As of last seen him he has been diagnosed with diabetes.  He lost a considerable amount of weight and his diabetes is now very well controlled.  His weight has been stable more recently.  He remains on insulin but hopefully will be able to come off and manage his diabetes with Metformin monotherapy over time.  Currently denies any reflux symptoms or dysphagia.  He denies any family history of colon cancer.  He has never had a prior colonoscopy.  He is a Biomedical scientist and works at [employer]  EGD 05/16/2015 - Normal esophagus without stenosis or stricture - biopsies obtained to rule out EoE Evidence of prior Nissen fundoplication, intact without laxity Erythematous gastropathy - biopsied to rule out H pylori Normal duodenum  Barium swallow 04/13/25 - IMPRESSION: 1. Normal esophageal motility. 2. No recurrent hiatal hernia. 3. Spontaneous and inducible GE reflux noted.    Past Medical History:  Diagnosis Date  . Diabetes mellitus ([place])   . Hyperlipidemia      Past Surgical History:  Procedure Laterality Date  . HERNIA REPAIR  1995  . I & D EXTREMITY Right 09/30/2018   Procedure: IRRIGATION AND DEBRIDEMENT R foot abscess;  Surgeon: Newt Minion, MD;  Location: [facility];  Service: Orthopedics;  Laterality: Right;  . TONSILLECTOMY  2004   Family History  Problem Relation Age of Onset  . Hypertension Mother   . Hypertension Father   . Aneurysm Father   . Breast cancer Maternal Grandmother   . Alcohol abuse Neg Hx   . Cancer Neg Hx   . COPD Neg Hx   . Diabetes Neg Hx   . Early death Neg Hx   . Drug abuse Neg Hx   . Hearing loss Neg Hx   . Heart disease Neg Hx   . Hyperlipidemia Neg Hx   .  Kidney disease Neg Hx   . Stroke Neg Hx    Social History   Tobacco Use  . Smoking status: Never Smoker  . Smokeless tobacco: Never Used  Substance Use Topics  . Alcohol use: Yes    Alcohol/week: 1.0 standard drinks    Types: 1 Glasses of wine per week  . Drug use: No   Current Outpatient Medications  Medication Sig Dispense Refill  . gabapentin (NEURONTIN) 300 MG capsule Take 1 capsule (300 mg total) by mouth 3 (three) times daily. 270 capsule 0  . Insulin Isophane & Regular Human (HUMULIN 70/30 KWIKPEN) (70-30) 100 UNIT/ML PEN Inject 8 Units into the skin 2 (two) times daily. 30 mL 2  . Insulin Syringe-Needle U-100 (ULTICARE INSULIN SYRINGE) 30G X 1/2" 0.5 ML MISC Use to inject insulin daily. 100 each 11  . Melatonin 1 MG TABS Take by mouth daily.    .  metFORMIN (GLUCOPHAGE) 1000 MG tablet Take 1 tablet (1,000 mg total) by mouth 2 (two) times daily with a meal. 180 tablet 3  . rosuvastatin (CRESTOR) 10 MG tablet Take 1 tablet (10 mg total) by mouth daily. To lower cholesterol 90 tablet 3  . tadalafil (CIALIS) 20 MG tablet Take 0.5-1 tablets (10-20 mg total) by mouth every other day as needed for erectile dysfunction. 5 tablet 11  . traZODone (DESYREL) 50 MG tablet Take 1 tablet (50 mg total) by mouth at bedtime as needed and may repeat dose one time if needed for sleep. 60 tablet 5  . polyethylene glycol (MIRALAX) 17 g packet Take 17 g by mouth daily as needed. 14 each 0  . SUPREP BOWEL PREP KIT 17.5-3.13-1.6 GM/177ML SOLN Suprep-Use as directed 354 mL 0   No current facility-administered medications for this visit.   Allergies  Allergen Reactions  . Ibuprofen Other (See Comments)    constipation     Review of Systems: All systems reviewed and negative except where noted in HPI.    Lab Results  Component Value Date   WBC 7.1 02/26/2019   HGB 16.0 02/26/2019   HCT 48.0 02/26/2019   MCV 84 02/26/2019   PLT 353 02/26/2019    Lab Results  Component Value Date   CREATININE 0.92 08/29/2019   BUN 12 08/29/2019   NA 144 08/29/2019   K 5.0 08/29/2019   CL 105 08/29/2019   CO2 24 08/29/2019    Lab Results  Component Value Date   ALT 19 08/29/2019   AST 28 08/29/2019   ALKPHOS 90 08/29/2019   BILITOT 0.3 08/29/2019    Lab Results  Component Value Date   TSH 1.140 02/26/2019    Physical Exam: BP 116/60   Pulse 66   Temp (!) 97 F (36.1 C)   Ht _0  (1.803 m)   Wt 199 lb (90.3 kg)   BMI 27.75 kg/m  Constitutional: Pleasant,well-developed, male in no acute distress. HEENT: Normocephalic and atraumatic. Conjunctivae are normal. No scleral icterus. Neck supple.  Cardiovascular: Normal rate, regular rhythm.  Pulmonary/chest: Effort normal and breath sounds normal. No wheezing, rales or rhonchi. Abdominal: Soft,  nondistended, nontender.  There are no masses palpable. . Extremities: no edema Lymphadenopathy: No cervical adenopathy noted. Neurological: Alert and oriented to person place and time. Skin: Skin is warm and dry. No rashes noted. Psychiatric: Normal mood and affect. Behavior is normal.   ASSESSMENT AND PLAN: 49 year old male here for reassessment of the following issues:  Change in bowel habits / constipation - over the  past 2 years has had a clear change in his bowel habits as outlined above.  Use of regular dosing of MiraLAX and Linzess has led to loose stools with urgency and he did not tolerate it.  Stool softeners providing some benefit as above.  Otherwise no alarm symptoms. No anemia. TSH normal.  Discussed the situation with him.  He likely has slow transit constipation, unclear if his diabetes affected his bowel motility or not as that is a recent diagnosis and has had some new medications in recent years.  That being said, given his age, and current recommendations to start colon cancer screening at age 10, colonoscopy is recommended to further evaluate the symptoms and perform his screening.  We discussed what colonoscopy is, risk benefits and how to prepare for it, he wanted to proceed.  In the interim he will continue stool softener as needed if that works for him, alternatively he could try a lower dose of MiraLAX or use every other day to see if that may work a little bit better for him.  He will consider doing that.  Further recommendations pending his course and results of his exam.  All questions answered he agreed  Atwood Cellar, MD [facility]  CC: Scot Jun, FNP

## 2019-10-11 NOTE — Patient Instructions (Addendum)
If you are age 50 or older, your body mass index should be between 23-30. Your Body mass index is 27.75 kg/m. If this is out of the aforementioned range listed, please consider follow up with your Primary Care Provider.  If you are age 70 or younger, your body mass index should be between 19-25. Your Body mass index is 27.75 kg/m. If this is out of the aformentioned range listed, please consider follow up with your Primary Care Provider.   You have been scheduled for a colonoscopy. Please follow written instructions given to you at your visit today.  Please pick up your prep supplies at the pharmacy within the next 1-3 days. If you use inhalers (even only as needed), please bring them with you on the day of your procedure.   Use your stool softener or low dose Miralax daily.  In the days leading up to your procedure, please use Miralax to keep your stool soft.   Thank you for entrusting me with your care and for choosing [facility], Dr. Ileene Patrick

## 2019-10-24 ENCOUNTER — Other Ambulatory Visit: Payer: Self-pay | Admitting: Nurse Practitioner

## 2019-10-24 DIAGNOSIS — E1142 Type 2 diabetes mellitus with diabetic polyneuropathy: Secondary | ICD-10-CM

## 2019-10-24 MED FILL — metFORMIN HCL 1000 MG TABS: 1000 | 90 days supply | Qty: 180 | Fill #0

## 2019-10-29 DIAGNOSIS — N5201 Erectile dysfunction due to arterial insufficiency: Secondary | ICD-10-CM | POA: Diagnosis not present

## 2019-11-01 DIAGNOSIS — N5201 Erectile dysfunction due to arterial insufficiency: Secondary | ICD-10-CM | POA: Diagnosis not present

## 2019-11-08 ENCOUNTER — Other Ambulatory Visit: Payer: Self-pay | Admitting: Internal Medicine

## 2019-11-08 ENCOUNTER — Telehealth: Payer: Self-pay | Admitting: Internal Medicine

## 2019-11-08 DIAGNOSIS — E1142 Type 2 diabetes mellitus with diabetic polyneuropathy: Secondary | ICD-10-CM

## 2019-11-08 MED ORDER — PREGABALIN 75 MG PO CAPS: 75.0000 mg | ORAL_CAPSULE | Freq: Two times a day (BID) | ORAL | 1 refills | Status: DC

## 2019-11-08 MED FILL — PREGABALIN 75 MG CAPS: 75 | 30 days supply | Qty: 60 | Fill #0

## 2019-11-08 NOTE — Telephone Encounter (Signed)
Patient called and requested a message to be sent to pcp that his gabapentin is not currently working. Patient stated that he is currently taking 6 pills a day and now his feet are swollen. Please follow up at your earliest convenience.    gabapentin (NEURONTIN) 300 MG capsule [[id number]]

## 2019-11-08 NOTE — Progress Notes (Signed)
I would recommend we d/c Gabapentin and start him on Lyrica and consider this a treatment failure of Gabapentin. Please inform him there may be a delay in being able to get the Lyrica if there is a PA that needs to be done. The foot edema can be a side effect of Gabapentin. However, if it does not resolve with discontinuation of the medication I would recommend him being seen for this.   Marcy Siren, D.O. Primary Care at [facility]  11/08/2019, 2:22 PM

## 2019-11-08 NOTE — Telephone Encounter (Signed)
Please advise 

## 2019-11-09 MED FILL — ROSUVASTATIN CALCIUM 10 MG: 10 | 30 days supply | Qty: 30 | Fill #6

## 2019-11-09 NOTE — Progress Notes (Signed)
Left voice mail to call back 

## 2019-11-12 NOTE — Progress Notes (Signed)
Patient notified of this information in person at [facility] on Friday.

## 2019-11-13 ENCOUNTER — Encounter: Payer: Self-pay | Admitting: Gastroenterology

## 2019-11-14 ENCOUNTER — Telehealth: Payer: Self-pay | Admitting: Gastroenterology

## 2019-11-14 NOTE — Telephone Encounter (Signed)
Hi Dr. Adela Lank, this pt just cancelled his colonoscopy that was scheduled with you tomorrow because his uncle passed away unexpectedly last night and he was his caregiver. He will call back to reschedule. Thank you.

## 2019-11-14 NOTE — Telephone Encounter (Signed)
I understand, thanks for letting me know. No cancellations charge, he can reschedule at his convenience.

## 2019-11-15 ENCOUNTER — Encounter: Payer: BC Managed Care – PPO | Admitting: Gastroenterology

## 2019-12-03 ENCOUNTER — Emergency Department (HOSPITAL_BASED_OUTPATIENT_CLINIC_OR_DEPARTMENT_OTHER)
Admission: EM | Admit: 2019-12-03 | Discharge: 2019-12-03 | Disposition: A | Discharge status: Home / Self Care | Payer: No Typology Code available for payment source | Attending: Emergency Medicine | Admitting: Emergency Medicine

## 2019-12-03 ENCOUNTER — Ambulatory Visit: Payer: Self-pay | Admitting: Physician Assistant

## 2019-12-03 ENCOUNTER — Other Ambulatory Visit: Payer: Self-pay

## 2019-12-03 ENCOUNTER — Encounter (HOSPITAL_BASED_OUTPATIENT_CLINIC_OR_DEPARTMENT_OTHER): Payer: Self-pay | Admitting: *Deleted

## 2019-12-03 VITALS — BP 125/83 | HR 103 | Temp 98.4°F | Ht 71.0 in | Wt 200.6 lb

## 2019-12-03 DIAGNOSIS — T25222A Burn of second degree of left foot, initial encounter: Secondary | ICD-10-CM | POA: Diagnosis not present

## 2019-12-03 DIAGNOSIS — Y929 Unspecified place or not applicable: Secondary | ICD-10-CM | POA: Insufficient documentation

## 2019-12-03 DIAGNOSIS — Y272XXA Contact with hot fluids, undetermined intent, initial encounter: Secondary | ICD-10-CM | POA: Diagnosis not present

## 2019-12-03 DIAGNOSIS — E119 Type 2 diabetes mellitus without complications: Secondary | ICD-10-CM | POA: Diagnosis not present

## 2019-12-03 DIAGNOSIS — Y939 Activity, unspecified: Secondary | ICD-10-CM | POA: Diagnosis not present

## 2019-12-03 DIAGNOSIS — Y990 Civilian activity done for income or pay: Secondary | ICD-10-CM | POA: Insufficient documentation

## 2019-12-03 DIAGNOSIS — T25212A Burn of second degree of left ankle, initial encounter: Secondary | ICD-10-CM | POA: Diagnosis not present

## 2019-12-03 DIAGNOSIS — E1159 Type 2 diabetes mellitus with other circulatory complications: Secondary | ICD-10-CM

## 2019-12-03 DIAGNOSIS — T310 Burns involving less than 10% of body surface: Secondary | ICD-10-CM | POA: Diagnosis not present

## 2019-12-03 DIAGNOSIS — T25211A Burn of second degree of right ankle, initial encounter: Secondary | ICD-10-CM

## 2019-12-03 DIAGNOSIS — T25221A Burn of second degree of right foot, initial encounter: Secondary | ICD-10-CM | POA: Diagnosis not present

## 2019-12-03 MED ORDER — SILVER SULFADIAZINE 1 % EX CREA: 1.0000 "application " | TOPICAL_CREAM | Freq: Two times a day (BID) | CUTANEOUS | 0 refills | Status: AC

## 2019-12-03 MED ORDER — DOXYCYCLINE HYCLATE 100 MG PO TABS
100.0000 mg | ORAL_TABLET | Freq: Once | ORAL | Status: AC
  Administered 2019-12-03: 100 mg via ORAL
  Filled 2019-12-03: qty 1

## 2019-12-03 MED ORDER — DOXYCYCLINE HYCLATE 100 MG PO CAPS: 100.0000 mg | ORAL_CAPSULE | Freq: Two times a day (BID) | ORAL | 0 refills | Status: DC

## 2019-12-03 MED ORDER — SILVER SULFADIAZINE 1 % EX CREA
TOPICAL_CREAM | Freq: Once | CUTANEOUS | Status: AC
  Filled 2019-12-03: qty 85
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
## 2019-12-03 NOTE — ED Notes (Signed)
Dr. Long ED Provider at bedside. 
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
## 2019-12-04 LAB — GLUCOSE, POCT (MANUAL RESULT ENTRY): POC Glucose: 125 mg/dl — AB (ref 70–99)

## 2019-12-05 MED FILL — ROSUVASTATIN CALCIUM 10 MG: 10 | 30 days supply | Qty: 30 | Fill #7

## 2019-12-06 ENCOUNTER — Telehealth: Payer: Self-pay

## 2019-12-06 ENCOUNTER — Other Ambulatory Visit: Payer: Self-pay

## 2019-12-06 ENCOUNTER — Encounter: Payer: Self-pay | Admitting: Internal Medicine

## 2019-12-06 ENCOUNTER — Ambulatory Visit (INDEPENDENT_AMBULATORY_CARE_PROVIDER_SITE_OTHER): Payer: No Typology Code available for payment source | Admitting: Internal Medicine

## 2019-12-06 VITALS — BP 117/78 | HR 78 | Temp 97.8°F | Ht 71.0 in | Wt 200.0 lb

## 2019-12-06 DIAGNOSIS — T300 Burn of unspecified body region, unspecified degree: Secondary | ICD-10-CM | POA: Diagnosis not present

## 2019-12-06 MED ORDER — COLLAGENASE 250 UNIT/GM EX OINT: 1.0000 "application " | TOPICAL_OINTMENT | Freq: Every day | CUTANEOUS | 0 refills | Status: DC

## 2019-12-06 MED FILL — PREGABALIN 75 MG CAPS: 75 | 30 days supply | Qty: 60 | Fill #1

## 2019-12-06 MED FILL — SANTYL OINTMENT: 250 | 15 days supply | Qty: 30 | Fill #0
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
## 2019-12-06 NOTE — Patient Instructions (Signed)
Ian Woodard,  It was a pleasure meeting you today.  A prescription for Santyl ointment was sent to your pharmacy.  Please call if you have any issues receiving this medication.  Please start using it daily over the wound covered by a nonstick pad then gauze and finally with an Ace bandage.  You can do this up to 2 times a day as needed.  I will see you in 1 week

## 2019-12-06 NOTE — Telephone Encounter (Signed)
Orders for wound/dressing supplies faxed to [facility] -per Dr. Mikey Bussing request... forwarded work comp info to [employer]   faxed orders for [employer] / office notes to: [employer] [facility]   Attention: Nyra Jabs (ph) [phone]  (fax) [phone] Case/event # [id number]  Copy of the above orders/fax confirmation scanned into chart

## 2019-12-06 NOTE — Telephone Encounter (Signed)
Call to Rosalie Doctor (ph) [phone] to obtain work/comp info to submit orders for wound supplies & RX per Dr. Mikey Bussing Per Arline Asp- work/comp info has been sent to pt via email

## 2019-12-13 ENCOUNTER — Encounter: Payer: Self-pay | Admitting: Internal Medicine

## 2019-12-13 ENCOUNTER — Ambulatory Visit (INDEPENDENT_AMBULATORY_CARE_PROVIDER_SITE_OTHER): Payer: No Typology Code available for payment source | Admitting: Internal Medicine

## 2019-12-13 ENCOUNTER — Other Ambulatory Visit: Payer: Self-pay

## 2019-12-13 VITALS — BP 119/81 | HR 70 | Temp 97.7°F | Ht 71.0 in | Wt 200.0 lb

## 2019-12-13 DIAGNOSIS — T300 Burn of unspecified body region, unspecified degree: Secondary | ICD-10-CM

## 2019-12-13 MED ORDER — EFINACONAZOLE 10 % EX SOLN: 1.0000 [drp] | Freq: Every day | CUTANEOUS | 0 refills | Status: DC

## 2019-12-13 NOTE — Patient Instructions (Signed)
Ian Woodard,  It was a pleasure seeing you today.  I have ordered a topical medication for your feet.  Please continue to use Santyl daily on the wound.  I will see you back in one week
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
## 2019-12-17 ENCOUNTER — Ambulatory Visit: Payer: BC Managed Care – PPO | Admitting: Internal Medicine

## 2019-12-20 ENCOUNTER — Other Ambulatory Visit: Payer: Self-pay

## 2019-12-20 ENCOUNTER — Encounter: Payer: Self-pay | Admitting: Internal Medicine

## 2019-12-20 ENCOUNTER — Ambulatory Visit: Payer: BC Managed Care – PPO | Admitting: Internal Medicine

## 2019-12-20 ENCOUNTER — Ambulatory Visit (INDEPENDENT_AMBULATORY_CARE_PROVIDER_SITE_OTHER): Payer: No Typology Code available for payment source | Admitting: Internal Medicine

## 2019-12-20 ENCOUNTER — Telehealth: Payer: Self-pay | Admitting: *Deleted

## 2019-12-20 VITALS — BP 121/81 | HR 67 | Temp 97.1°F | Ht 71.0 in | Wt 200.0 lb

## 2019-12-20 DIAGNOSIS — T300 Burn of unspecified body region, unspecified degree: Secondary | ICD-10-CM

## 2019-12-20 DIAGNOSIS — B351 Tinea unguium: Secondary | ICD-10-CM | POA: Diagnosis not present

## 2019-12-20 MED ORDER — CICLOPIROX 8 % EX SOLN: Freq: Every day | CUTANEOUS | 0 refills | Status: DC

## 2019-12-20 MED FILL — CICLOPIROX 8% SOLUTION: 8 | 14 days supply | Qty: 7 | Fill #0

## 2019-12-20 NOTE — Telephone Encounter (Signed)
Fax order to [facility] for supplies for the patient.  Confirmation received. Copy scanned into the chart.//AB/CMA 
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
## 2019-12-27 ENCOUNTER — Encounter: Payer: Self-pay | Admitting: Internal Medicine

## 2019-12-27 ENCOUNTER — Ambulatory Visit (INDEPENDENT_AMBULATORY_CARE_PROVIDER_SITE_OTHER): Payer: No Typology Code available for payment source | Admitting: Internal Medicine

## 2019-12-27 ENCOUNTER — Other Ambulatory Visit: Payer: Self-pay

## 2019-12-27 VITALS — BP 120/82 | HR 84 | Temp 98.2°F | Ht 71.0 in | Wt 200.0 lb

## 2019-12-27 DIAGNOSIS — T300 Burn of unspecified body region, unspecified degree: Secondary | ICD-10-CM | POA: Diagnosis not present

## 2019-12-27 DIAGNOSIS — B351 Tinea unguium: Secondary | ICD-10-CM
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
## 2019-12-27 NOTE — Patient Instructions (Addendum)
Ian Woodard It was a pleasure seeing you today.  Please follow the instructions below for your wound care  1) Xeroform dressing changes with santyl daily 2) followed by non stick pad 3) wrap with gauze 4) cover with ace bandage   Please follow up with me in 1 weeks.  Call us at [phone] with any questions or concerns   Due to your increased swelling around the wound I recommend not standing for more than a couple hours straight I will give a work notice to start 5/27 and reassess at your next visit in one week.

## 2019-12-28 ENCOUNTER — Telehealth: Payer: Self-pay | Admitting: *Deleted

## 2019-12-28 NOTE — Telephone Encounter (Signed)
Called on (12/26/09@ 10:43am) and spoke with the patient's supervisor Arline Asp Essa) @ [employer]. Informed her that Dr. Mikey Bussing would like for the patient to be out of work for 1 week starting (12/28/19) due to increase swelling around the wound.  Patient will be followed up in 1 week.  Cindy verbalized understanding and agreed.//AB/CMA

## 2020-01-03 ENCOUNTER — Encounter: Payer: Self-pay | Admitting: Internal Medicine

## 2020-01-03 ENCOUNTER — Other Ambulatory Visit: Payer: Self-pay

## 2020-01-03 ENCOUNTER — Ambulatory Visit (INDEPENDENT_AMBULATORY_CARE_PROVIDER_SITE_OTHER): Payer: No Typology Code available for payment source | Admitting: Internal Medicine

## 2020-01-03 ENCOUNTER — Telehealth: Payer: Self-pay | Admitting: *Deleted

## 2020-01-03 VITALS — BP 120/81 | HR 76 | Temp 97.5°F | Ht 71.0 in | Wt 200.0 lb

## 2020-01-03 DIAGNOSIS — B351 Tinea unguium: Secondary | ICD-10-CM | POA: Diagnosis not present

## 2020-01-03 DIAGNOSIS — T300 Burn of unspecified body region, unspecified degree: Secondary | ICD-10-CM
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
## 2020-01-03 NOTE — Telephone Encounter (Signed)
Called on (01/03/20 @ 12:00pm) @ [phone]),and spoke with the patient's supervisor Arline Asp Essa) @ [employer]. Informed her that Dr. Mikey Bussing would like for the patient to be out of work for another week due to increase swelling around the wound.  Patient will be followed up in 1 week.  Cindy verbalized understanding and agreed.  She stated that the patient had also sent a note stating he will be out of work another week. //AB/CMA

## 2020-01-03 NOTE — Patient Instructions (Signed)
Ian Woodard It was a pleasure seeing you today.  Please follow the instructions below for your wound care  1) xeroform and santyl dressing changes daily 2) followed by non stick pad 3) cover with ace bandage   Please follow up with me in 1 week.  Call us at [phone] with any questions or concerns  You continue to have swelling in your ankles.  Please stay out of work for one more week

## 2020-01-10 ENCOUNTER — Telehealth: Payer: Self-pay | Admitting: *Deleted

## 2020-01-10 ENCOUNTER — Encounter: Payer: Self-pay | Admitting: Internal Medicine

## 2020-01-10 ENCOUNTER — Ambulatory Visit (INDEPENDENT_AMBULATORY_CARE_PROVIDER_SITE_OTHER): Payer: No Typology Code available for payment source | Admitting: Internal Medicine

## 2020-01-10 ENCOUNTER — Other Ambulatory Visit: Payer: Self-pay

## 2020-01-10 VITALS — BP 130/86 | HR 80 | Temp 96.9°F

## 2020-01-10 DIAGNOSIS — T300 Burn of unspecified body region, unspecified degree: Secondary | ICD-10-CM | POA: Diagnosis not present

## 2020-01-10 NOTE — Telephone Encounter (Signed)
Called on (01/10/20 @ 3:55pm) and spoke with the patient's supervisor Arline Asp Essa) @ [employer]. Informed her that Dr. Mikey Bussing would like for the patient to be out of work until 01/21/20 due to increase swelling around the wound.  Patient will be followed up in 1 week.  Arline Asp verbalized she has received the letter with this information.  She verbalized understanding and agreed.//AB/CMA
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
## 2020-01-17 ENCOUNTER — Encounter: Payer: Self-pay | Admitting: Internal Medicine

## 2020-01-17 ENCOUNTER — Other Ambulatory Visit: Payer: Self-pay

## 2020-01-17 ENCOUNTER — Ambulatory Visit (INDEPENDENT_AMBULATORY_CARE_PROVIDER_SITE_OTHER): Payer: No Typology Code available for payment source | Admitting: Internal Medicine

## 2020-01-17 VITALS — BP 122/74 | HR 76 | Temp 97.5°F

## 2020-01-17 DIAGNOSIS — T300 Burn of unspecified body region, unspecified degree: Secondary | ICD-10-CM | POA: Diagnosis not present
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
## 2020-01-18 ENCOUNTER — Telehealth: Payer: Self-pay | Admitting: *Deleted

## 2020-01-18 NOTE — Telephone Encounter (Signed)
Called on (01/17/20 @ 1:38 pm) and spoke with the patient's supervisor [facility] Essa) @ [employer] @ [phone]).  Informed her that Dr. Mikey Bussing would like for the patient to be out of work until his next appointment due to wound blistering and weakness.  Cindy verbalized understanding and agreed.//AB/CMA

## 2020-01-22 ENCOUNTER — Encounter: Payer: Self-pay | Admitting: Internal Medicine

## 2020-01-22 ENCOUNTER — Telehealth: Payer: Self-pay

## 2020-01-22 ENCOUNTER — Ambulatory Visit (INDEPENDENT_AMBULATORY_CARE_PROVIDER_SITE_OTHER): Payer: No Typology Code available for payment source | Admitting: Internal Medicine

## 2020-01-22 ENCOUNTER — Other Ambulatory Visit: Payer: Self-pay

## 2020-01-22 VITALS — BP 126/81 | HR 72 | Temp 97.3°F | Ht 71.0 in | Wt 200.0 lb

## 2020-01-22 DIAGNOSIS — T300 Burn of unspecified body region, unspecified degree: Secondary | ICD-10-CM | POA: Diagnosis not present
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
## 2020-01-22 NOTE — Telephone Encounter (Signed)
[facility], the boss at [employer] and advised patients wound has opened up and has an appointment with Dr. Mikey Bussing on 01/31/20 for a reassessment.

## 2020-01-25 ENCOUNTER — Telehealth: Payer: Self-pay | Admitting: Internal Medicine

## 2020-01-25 NOTE — Telephone Encounter (Signed)
Salik called in to request more of the Cutimed superabsorbent dressings. He said he has plenty of everything else but needs about 2-3 boxes of the dressings. Please call and let him know if this is something we need to order for him or if he needs to do it. [phone]

## 2020-01-25 NOTE — Telephone Encounter (Signed)
Called [facility]-spoke with Sharay for additional order for Cutimed superabsorbent dressing.Called patient, LMVM that his dressings have been called in.

## 2020-01-31 ENCOUNTER — Ambulatory Visit (INDEPENDENT_AMBULATORY_CARE_PROVIDER_SITE_OTHER): Payer: No Typology Code available for payment source | Admitting: Internal Medicine

## 2020-01-31 ENCOUNTER — Encounter: Payer: Self-pay | Admitting: Internal Medicine

## 2020-01-31 ENCOUNTER — Other Ambulatory Visit: Payer: Self-pay

## 2020-01-31 VITALS — BP 112/79 | HR 81 | Temp 96.6°F

## 2020-01-31 DIAGNOSIS — T300 Burn of unspecified body region, unspecified degree: Secondary | ICD-10-CM
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
## 2020-02-05 ENCOUNTER — Telehealth: Payer: Self-pay | Admitting: *Deleted

## 2020-02-05 NOTE — Telephone Encounter (Signed)
Called at (2:28pm), and [facility] asking the patient to RTC regarding supplies wound dressings.//AB/CMA

## 2020-02-06 MED FILL — PREGABALIN 75 MG CAPS: 75 | 30 days supply | Qty: 60 | Fill #3

## 2020-02-06 MED FILL — ROSUVASTATIN CALCIUM 10 MG: 10 | 30 days supply | Qty: 30 | Fill #9

## 2020-02-06 NOTE — Telephone Encounter (Signed)
Spoke with the patient on (02/05/20) regarding wound supplies.  Informed him that I spoke with Dr. Mikey Bussing and she stated that since the wound is closed all he needs is something to protective the area.    I explained that the gauze that [facility] stated that he needed will require a open wound.    The patient stated not to worry about getting anything.  He's fine.//AB/CMA

## 2020-02-07 ENCOUNTER — Other Ambulatory Visit: Payer: Self-pay

## 2020-02-07 ENCOUNTER — Ambulatory Visit: Payer: BC Managed Care – PPO | Admitting: Internal Medicine

## 2020-02-07 ENCOUNTER — Encounter: Payer: Self-pay | Admitting: Internal Medicine

## 2020-02-07 ENCOUNTER — Ambulatory Visit (INDEPENDENT_AMBULATORY_CARE_PROVIDER_SITE_OTHER): Payer: No Typology Code available for payment source | Admitting: Internal Medicine

## 2020-02-07 VITALS — BP 124/79 | HR 76 | Temp 96.9°F

## 2020-02-07 DIAGNOSIS — T300 Burn of unspecified body region, unspecified degree: Secondary | ICD-10-CM | POA: Diagnosis not present
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
## 2020-02-08 DIAGNOSIS — Z719 Counseling, unspecified: Secondary | ICD-10-CM

## 2020-02-28 ENCOUNTER — Encounter: Payer: Self-pay | Admitting: Internal Medicine

## 2020-02-28 ENCOUNTER — Other Ambulatory Visit: Payer: Self-pay

## 2020-02-28 ENCOUNTER — Ambulatory Visit (INDEPENDENT_AMBULATORY_CARE_PROVIDER_SITE_OTHER): Payer: No Typology Code available for payment source | Admitting: Internal Medicine

## 2020-02-28 VITALS — BP 104/74 | HR 87 | Temp 98.3°F

## 2020-02-28 DIAGNOSIS — M25471 Effusion, right ankle: Secondary | ICD-10-CM | POA: Diagnosis not present

## 2020-02-28 DIAGNOSIS — M25472 Effusion, left ankle: Secondary | ICD-10-CM

## 2020-02-28 DIAGNOSIS — M25475 Effusion, left foot: Secondary | ICD-10-CM
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
## 2020-03-11 ENCOUNTER — Other Ambulatory Visit: Payer: Self-pay

## 2020-03-11 ENCOUNTER — Ambulatory Visit (INDEPENDENT_AMBULATORY_CARE_PROVIDER_SITE_OTHER): Payer: No Typology Code available for payment source | Admitting: Internal Medicine

## 2020-03-11 ENCOUNTER — Encounter: Payer: Self-pay | Admitting: Internal Medicine

## 2020-03-11 VITALS — BP 115/76 | HR 71 | Temp 97.5°F

## 2020-03-11 DIAGNOSIS — M25472 Effusion, left ankle: Secondary | ICD-10-CM | POA: Diagnosis not present

## 2020-03-11 DIAGNOSIS — S91301D Unspecified open wound, right foot, subsequent encounter: Secondary | ICD-10-CM

## 2020-03-11 DIAGNOSIS — M25471 Effusion, right ankle: Secondary | ICD-10-CM | POA: Diagnosis not present

## 2020-03-11 DIAGNOSIS — M25475 Effusion, left foot: Secondary | ICD-10-CM

## 2020-03-11 NOTE — Progress Notes (Addendum)
   Subjective:     Patient ID: Ian Woodard, male    DOB: 10-02-1970, 49 y.o.   MRN: [id number]  Chief Complaint  Patient presents with  . Follow-up bilateral ankle swelling and acute open wound    HPI: The patient is a 49 y.o. male here for follow-up bilateral ankle swelling and acute open wound on the right dorsal foot  Patient was seen in the clinic on 7/29 for acute bilateral ankle swelling.  At that time it was recommended to use compression socks and elevate feet.  Patient had already planned a few trips and was unable to stay off of his feet for long periods of time.  But he did state that when he would wake up in the morning ankle swelling was improved.    He also has a small open wound on the top of his right foot.  He states that he thinks his dog may have accidentally scratched it.  He is currently keeping it covered with a nonstick pad.  Review of Systems  All other systems reviewed and are negative.    has a past medical history of Diabetes mellitus (HCC) and Hyperlipidemia.  has a past surgical history that includes Hernia repair (1995); Tonsillectomy (2004); and I & D extremity (Right, 09/30/2018).  reports that he has never smoked. He has never used smokeless tobacco. Objective:   Vital Signs BP 115/76 (BP Location: Left Arm, Patient Position: Sitting, Cuff Size: Large)   Pulse 71   Temp (!) 97.5 F (36.4 C) (Oral)   SpO2 99%  Vital Signs and Nursing Note Reviewed Physical Exam Skin:    Comments: Bilateral ankle swelling worse on the left.  Nonpitting edema to the left ankle Minimal swelling to the right ankle 2+ pedal pulses Right foot wound measures: 1.0 x 1.0 x 0.1 cm     Assessment/Plan:     ICD-10-CM   1. Bilateral swelling of feet and ankles  M25.471    M25.474    M25.475    M25.472   2. Open wound of right foot, subsequent encounter  S91.301D     Assessment: Bilateral ankle swelling Ankle swelling has improved slightly since last clinic  visit.  Patient did spend a significant amount of time on his feet over the past week.  I encouraged that now that he is not traveling to elevate his feet when resting and using compression socks.  Plan -Compression socks  Assessment: Right foot wound  Patient has a small open wound on his right foot on the dorsal aspect.  No signs of infection.  He thinks this is from his dog.  I recommended using bacitracin ointment daily and keep it covered.  He can do this for 1 to 2 weeks.  Plan -Bacitracin ointment daily on the right foot and keep covered    Aldean Baker, DO 03/11/2020, 3:06 PM

## 2020-03-18 MED FILL — PREGABALIN 75 MG CAPS: 75 | 30 days supply | Qty: 60 | Fill #4

## 2020-03-31 ENCOUNTER — Ambulatory Visit (INDEPENDENT_AMBULATORY_CARE_PROVIDER_SITE_OTHER): Payer: No Typology Code available for payment source | Admitting: Internal Medicine

## 2020-03-31 ENCOUNTER — Encounter: Payer: Self-pay | Admitting: Internal Medicine

## 2020-03-31 ENCOUNTER — Other Ambulatory Visit: Payer: Self-pay

## 2020-03-31 VITALS — BP 124/78 | HR 70 | Temp 97.9°F

## 2020-03-31 DIAGNOSIS — S91301D Unspecified open wound, right foot, subsequent encounter: Secondary | ICD-10-CM

## 2020-03-31 DIAGNOSIS — M25471 Effusion, right ankle: Secondary | ICD-10-CM | POA: Diagnosis not present

## 2020-03-31 DIAGNOSIS — M25472 Effusion, left ankle: Secondary | ICD-10-CM

## 2020-03-31 DIAGNOSIS — M25475 Effusion, left foot: Secondary | ICD-10-CM
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
## 2020-04-01 ENCOUNTER — Telehealth: Payer: Self-pay

## 2020-04-01 ENCOUNTER — Ambulatory Visit (HOSPITAL_COMMUNITY)
Admission: RE | Admit: 2020-04-01 | Discharge: 2020-04-01 | Disposition: A | Discharge status: Home / Self Care | Payer: No Typology Code available for payment source | Source: Ambulatory Visit | Attending: Internal Medicine | Admitting: Internal Medicine

## 2020-04-01 DIAGNOSIS — M25471 Effusion, right ankle: Secondary | ICD-10-CM | POA: Insufficient documentation

## 2020-04-01 DIAGNOSIS — M25472 Effusion, left ankle: Secondary | ICD-10-CM | POA: Insufficient documentation

## 2020-04-01 DIAGNOSIS — M25475 Effusion, left foot: Secondary | ICD-10-CM

## 2020-04-01 DIAGNOSIS — M25474 Effusion, right foot: Secondary | ICD-10-CM | POA: Insufficient documentation

## 2020-04-01 NOTE — Telephone Encounter (Signed)
Patient is negative for DVT

## 2020-04-02 ENCOUNTER — Other Ambulatory Visit: Payer: Self-pay

## 2020-04-02 ENCOUNTER — Ambulatory Visit (HOSPITAL_COMMUNITY)
Admission: RE | Admit: 2020-04-02 | Discharge: 2020-04-02 | Disposition: A | Discharge status: Home / Self Care | Payer: No Typology Code available for payment source | Source: Ambulatory Visit | Attending: Internal Medicine | Admitting: Internal Medicine

## 2020-04-02 ENCOUNTER — Other Ambulatory Visit: Payer: Self-pay | Admitting: Internal Medicine

## 2020-04-02 DIAGNOSIS — M25471 Effusion, right ankle: Secondary | ICD-10-CM | POA: Insufficient documentation

## 2020-04-02 DIAGNOSIS — M25474 Effusion, right foot: Secondary | ICD-10-CM | POA: Insufficient documentation

## 2020-04-02 DIAGNOSIS — M25472 Effusion, left ankle: Secondary | ICD-10-CM | POA: Insufficient documentation

## 2020-04-02 DIAGNOSIS — M25475 Effusion, left foot: Secondary | ICD-10-CM

## 2020-04-08 ENCOUNTER — Telehealth: Payer: Self-pay | Admitting: Internal Medicine

## 2020-04-08 NOTE — Telephone Encounter (Signed)
Patient called to find out if there is any news on the xray he had done last week. Please call to advise.

## 2020-04-09 NOTE — Telephone Encounter (Signed)
Called and [facility] @ 4:13pm) asking the patient to give me a call back regarding recent xray results.//AB/CMA

## 2020-04-10 NOTE — Telephone Encounter (Signed)
Called and spoke with the patient and informed him of recent left ankle x-ray results.    The x-ray showed: diffuse soft tissue edema                                Mild midfoot osteoarthritis                                No acute bony abnormality   He asked if the x-ray showed any fracture, and I informed him there were no fractures seen on the x-ray.  He also asked what was the next step, I informed him that Dr. Mikey Bussing will discuss this with him at his next appointment which is next Wednesday.  Patient verbalized understanding and agreed.//AB/CMA

## 2020-04-16 ENCOUNTER — Ambulatory Visit (INDEPENDENT_AMBULATORY_CARE_PROVIDER_SITE_OTHER): Payer: No Typology Code available for payment source | Admitting: Internal Medicine

## 2020-04-16 ENCOUNTER — Encounter: Payer: Self-pay | Admitting: Internal Medicine

## 2020-04-16 ENCOUNTER — Other Ambulatory Visit: Payer: Self-pay

## 2020-04-16 VITALS — BP 118/81 | HR 68 | Temp 97.7°F

## 2020-04-16 DIAGNOSIS — M25472 Effusion, left ankle: Secondary | ICD-10-CM | POA: Diagnosis not present

## 2020-04-16 DIAGNOSIS — M25475 Effusion, left foot: Secondary | ICD-10-CM

## 2020-04-16 DIAGNOSIS — M25471 Effusion, right ankle: Secondary | ICD-10-CM

## 2020-04-16 DIAGNOSIS — M25474 Effusion, right foot: Secondary | ICD-10-CM

## 2020-04-16 NOTE — Progress Notes (Signed)
   Subjective:     Patient ID: Ian Woodard, male    DOB: 10-28-1970, 49 y.o.   MRN: [id number]  Chief Complaint  Patient presents with  . Follow-up of bilateral ankle swelling    HPI: The patient is a 49 y.o. male here for follow-up of bilateral ankle swelling.   Patient continues to have left ankle swelling that has caused him anxiety.  He has used compression therapy and kept his legs elevated with minimal benefit to his symptoms.  The swelling is overall stable with no increased pain over the past month.  The wounds on his ankles bilaterally have been healed and closed for the past 3 weeks.   Review of Systems  All other systems reviewed and are negative.    has a past medical history of Diabetes mellitus (HCC) and Hyperlipidemia.  has a past surgical history that includes Hernia repair (1995); Tonsillectomy (2004); and I & D extremity (Right, 09/30/2018).  reports that he has never smoked. He has never used smokeless tobacco. Objective:   Vital Signs BP 118/81 (BP Location: Left Arm, Patient Position: Sitting, Cuff Size: Large)   Pulse 68   Temp 97.7 F (36.5 C) (Oral)   SpO2 98%  Vital Signs and Nursing Note Reviewed Physical Exam Skin:    Comments: Bilateral ankle swelling worse on the left.  Nonpitting edema to the left ankle Minimal swelling to the right ankle 2+ pedal pulses      Assessment/Plan:     ICD-10-CM   1. Bilateral swelling of feet and ankles  M25.471 Ambulatory referral to Vascular Surgery   M25.474    M25.475    M25.472    Assessment: Bilateral ankle swelling  Patient had a DVT study done to his left lower extremity and x-ray to the left ankle since last clinic visit. The DVT study was negative for DVT.  The left ankle x-ray showed diffuse soft tissue edema, mild midfoot osteoarthritis with no acute bony abnormality.  Results discussed with patient.  He is requesting a handicap sticker due to his current issue.  I will do a temporary one for 3  months.  I also asked him to follow-up with his PCP for this ongoing issue.  At this time I will also refer to vascular for their input.  I will have patient follow-up in 2-3 months.  Plan -Referral to vascular surgery -Continue compression therapy -Follow-up in 2-3 months  Aldean Baker, DO 04/16/2020, 10:28 AM

## 2020-04-22 ENCOUNTER — Ambulatory Visit (INDEPENDENT_AMBULATORY_CARE_PROVIDER_SITE_OTHER): Payer: BC Managed Care – PPO | Admitting: Critical Care Medicine

## 2020-04-22 ENCOUNTER — Other Ambulatory Visit: Payer: Self-pay

## 2020-04-22 VITALS — BP 123/83 | HR 72 | Temp 97.2°F | Resp 17 | Wt 207.0 lb

## 2020-04-22 DIAGNOSIS — E1142 Type 2 diabetes mellitus with diabetic polyneuropathy: Secondary | ICD-10-CM | POA: Diagnosis not present

## 2020-04-22 DIAGNOSIS — M25471 Effusion, right ankle: Secondary | ICD-10-CM

## 2020-04-22 DIAGNOSIS — M25474 Effusion, right foot: Secondary | ICD-10-CM

## 2020-04-22 DIAGNOSIS — E785 Hyperlipidemia, unspecified: Secondary | ICD-10-CM | POA: Diagnosis not present

## 2020-04-22 DIAGNOSIS — G4700 Insomnia, unspecified: Secondary | ICD-10-CM

## 2020-04-22 DIAGNOSIS — E1159 Type 2 diabetes mellitus with other circulatory complications: Secondary | ICD-10-CM | POA: Diagnosis not present

## 2020-04-22 DIAGNOSIS — M25475 Effusion, left foot: Secondary | ICD-10-CM | POA: Insufficient documentation

## 2020-04-22 DIAGNOSIS — M25472 Effusion, left ankle: Secondary | ICD-10-CM

## 2020-04-22 DIAGNOSIS — Z1159 Encounter for screening for other viral diseases: Secondary | ICD-10-CM | POA: Diagnosis not present

## 2020-04-22 DIAGNOSIS — Z1211 Encounter for screening for malignant neoplasm of colon: Secondary | ICD-10-CM

## 2020-04-22 MED ORDER — BASAGLAR KWIKPEN 100 UNIT/ML ~~LOC~~ SOPN: 8.0000 [IU] | PEN_INJECTOR | Freq: Every day | SUBCUTANEOUS | 1 refills | Status: AC

## 2020-04-22 MED ORDER — ROSUVASTATIN CALCIUM 10 MG PO TABS: 10.0000 mg | ORAL_TABLET | Freq: Every day | ORAL | 3 refills | Status: AC

## 2020-04-22 MED ORDER — PREGABALIN 75 MG PO CAPS: 75.0000 mg | ORAL_CAPSULE | Freq: Two times a day (BID) | ORAL | 1 refills | Status: AC

## 2020-04-22 MED ORDER — METFORMIN HCL 1000 MG PO TABS: 1000.0000 mg | ORAL_TABLET | Freq: Two times a day (BID) | ORAL | 1 refills | Status: AC

## 2020-04-22 MED ORDER — TRAZODONE HCL 50 MG PO TABS: 50.0000 mg | ORAL_TABLET | Freq: Every evening | ORAL | 5 refills | Status: AC | PRN

## 2020-04-22 MED FILL — ROSUVASTATIN CALCIUM 10 MG: 10 | 30 days supply | Qty: 30 | Fill #0

## 2020-04-22 MED FILL — metFORMIN HCL 1000 MG TABS: 1000 | 30 days supply | Qty: 60 | Fill #0

## 2020-04-22 MED FILL — traZODone HCL 50 MG TABS: 50 | 30 days supply | Qty: 30 | Fill #0

## 2020-04-22 MED FILL — PREGABALIN 75 MG CAPS: 75 | 30 days supply | Qty: 60 | Fill #0
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
## 2020-04-22 NOTE — Assessment & Plan Note (Signed)
History of insomnia will refill trazodone

## 2020-04-22 NOTE — Assessment & Plan Note (Signed)
Using shared decision making model patient is in agreement with refilling Crestor 10 mg daily

## 2020-04-22 NOTE — Assessment & Plan Note (Signed)
Diabetic neuropathy we will continue Lyrica

## 2020-04-22 NOTE — Assessment & Plan Note (Signed)
Type 2 diabetes without evidence of type I Associated neuropathy and recurrent diabetic foot infections now with edema of the left ankle with tenderness   Plan for this patient will be to discontinue 7030 and begin Basaglar insulin 8 units daily continue Metformin twice daily  Will obtain urine for microalbumin hemoglobin A1c and obtain lipid levels and resume Crestor 10 mg daily

## 2020-04-22 NOTE — Patient Instructions (Signed)
MRI of foot Left will be done  Arterial doppler of left leg will be done  Refills on all medications sent to community pharmacy  Stop 70/30 insulin  Start basaglar insulin 8 units every night  Labs today: metabolic panel, blood counts, urine microalb, lipid panel , A1C  Return to see Dr Earlene Plater one month  Keep vascular appointment for now  Financial assistance paperwork given

## 2020-04-23 LAB — LIPID PANEL
Chol/HDL Ratio: 5.6 ratio — ABNORMAL HIGH (ref 0.0–5.0)
Cholesterol, Total: 219 mg/dL — ABNORMAL HIGH (ref 100–199)
HDL: 39 mg/dL — ABNORMAL LOW (ref >39)
LDL Chol Calc (NIH): 143 mg/dL — ABNORMAL HIGH (ref 0–99)
Triglycerides: 202 mg/dL — ABNORMAL HIGH (ref 0–149)
VLDL Cholesterol Cal: 37 mg/dL (ref 5–40)

## 2020-04-23 LAB — HCV INTERPRETATION

## 2020-04-23 LAB — COMPREHENSIVE METABOLIC PANEL
ALT: 10 IU/L (ref 0–44)
AST: 16 IU/L (ref 0–40)
Albumin/Globulin Ratio: 1.8 (ref 1.2–2.2)
Albumin: 4.9 g/dL (ref 4.0–5.0)
Alkaline Phosphatase: 150 IU/L — ABNORMAL HIGH (ref 44–121)
BUN/Creatinine Ratio: 20 (ref 9–20)
BUN: 14 mg/dL (ref 6–24)
Bilirubin Total: 0.4 mg/dL (ref 0.0–1.2)
CO2: 23 mmol/L (ref 20–29)
Calcium: 9.6 mg/dL (ref 8.7–10.2)
Chloride: 101 mmol/L (ref 96–106)
Creatinine, Ser: 0.7 mg/dL — ABNORMAL LOW (ref 0.76–1.27)
GFR calc Af Amer: 127 mL/min/{1.73_m2} (ref >59)
GFR calc non Af Amer: 110 mL/min/{1.73_m2} (ref >59)
Globulin, Total: 2.8 g/dL (ref 1.5–4.5)
Glucose: 79 mg/dL (ref 65–99)
Potassium: 4.9 mmol/L (ref 3.5–5.2)
Sodium: 139 mmol/L (ref 134–144)
Total Protein: 7.7 g/dL (ref 6.0–8.5)

## 2020-04-23 LAB — MICROALBUMIN / CREATININE URINE RATIO
Creatinine, Urine: 38.1 mg/dL
Microalb/Creat Ratio: <8 mg/g creat (ref 0–29)
Microalbumin, Urine: <3 ug/mL

## 2020-04-23 LAB — HEMOGLOBIN A1C
Est. average glucose Bld gHb Est-mCnc: 134 mg/dL
Hgb A1c MFr Bld: 6.3 % — ABNORMAL HIGH (ref 4.8–5.6)

## 2020-04-23 LAB — HCV AB W/RFLX TO VERIFICATION: HCV Ab: <0.1 s/co ratio (ref 0.0–0.9)

## 2020-04-23 MED FILL — LANTUS SOLOSTAR 100 UNITS/M: 100 | 37 days supply | Qty: 3 | Fill #0

## 2020-04-24 ENCOUNTER — Telehealth: Payer: Self-pay | Admitting: *Deleted

## 2020-04-24 ENCOUNTER — Other Ambulatory Visit: Payer: Self-pay | Admitting: Internal Medicine

## 2020-04-24 DIAGNOSIS — M25475 Effusion, left foot: Secondary | ICD-10-CM

## 2020-04-24 DIAGNOSIS — M25471 Effusion, right ankle: Secondary | ICD-10-CM

## 2020-04-24 DIAGNOSIS — M25572 Pain in left ankle and joints of left foot: Secondary | ICD-10-CM

## 2020-04-24 DIAGNOSIS — L905 Scar conditions and fibrosis of skin: Secondary | ICD-10-CM

## 2020-04-24 DIAGNOSIS — G8929 Other chronic pain: Secondary | ICD-10-CM

## 2020-04-24 NOTE — Progress Notes (Unsigned)
m °

## 2020-04-24 NOTE — Telephone Encounter (Signed)
Attempt to get prior authorization for this patient to have a MRI of left foot w/o contrast.  The dx codes that were given did not match or were not accepted for patient to have this procedure.  Given: M25.471, M25.47 (right foot ) M25.475, M25.472.   Unable to proceed with the imaging at this time.   Please advise.

## 2020-04-26 ENCOUNTER — Ambulatory Visit (HOSPITAL_COMMUNITY): Payer: BC Managed Care – PPO

## 2020-04-27 ENCOUNTER — Ambulatory Visit (HOSPITAL_COMMUNITY): Payer: BC Managed Care – PPO

## 2020-04-28 ENCOUNTER — Telehealth: Payer: Self-pay | Admitting: Internal Medicine

## 2020-04-28 DIAGNOSIS — G8929 Other chronic pain: Secondary | ICD-10-CM | POA: Insufficient documentation

## 2020-04-28 DIAGNOSIS — L905 Scar conditions and fibrosis of skin: Secondary | ICD-10-CM | POA: Insufficient documentation

## 2020-04-28 NOTE — Telephone Encounter (Signed)
See new DX added to this encounter. Try these dx.  If this does not work will need to tell patient a specialty MD will need to see him either [facility] foot /ankle or orthopedics to proceed with foot evaluation further beyond primary care evaluations   L90.5  M25.572  Note ankle foot plain xray was normal

## 2020-04-28 NOTE — Telephone Encounter (Signed)
Seward Speck from the [employer] is calling to request the last OV and MRI order for the patients workers comp claim.   Fax number [phone] Phone # [phone]  Reference # [id number]

## 2020-05-01 ENCOUNTER — Other Ambulatory Visit: Payer: Self-pay | Admitting: Internal Medicine

## 2020-05-01 DIAGNOSIS — M25475 Effusion, left foot: Secondary | ICD-10-CM

## 2020-05-05 ENCOUNTER — Ambulatory Visit (INDEPENDENT_AMBULATORY_CARE_PROVIDER_SITE_OTHER): Payer: No Typology Code available for payment source | Admitting: Internal Medicine

## 2020-05-05 ENCOUNTER — Other Ambulatory Visit: Payer: Self-pay

## 2020-05-05 ENCOUNTER — Encounter: Payer: Self-pay | Admitting: Internal Medicine

## 2020-05-05 VITALS — BP 132/83 | HR 71 | Temp 97.7°F

## 2020-05-05 DIAGNOSIS — M25475 Effusion, left foot: Secondary | ICD-10-CM | POA: Diagnosis not present

## 2020-05-05 DIAGNOSIS — M25474 Effusion, right foot: Secondary | ICD-10-CM

## 2020-05-05 DIAGNOSIS — M25472 Effusion, left ankle: Secondary | ICD-10-CM | POA: Diagnosis not present

## 2020-05-05 DIAGNOSIS — M25471 Effusion, right ankle: Secondary | ICD-10-CM | POA: Diagnosis not present
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
## 2020-05-08 ENCOUNTER — Telehealth: Payer: Self-pay | Admitting: Internal Medicine

## 2020-05-08 ENCOUNTER — Other Ambulatory Visit: Payer: Self-pay | Admitting: Internal Medicine

## 2020-05-08 ENCOUNTER — Encounter: Payer: Self-pay | Admitting: Internal Medicine

## 2020-05-08 DIAGNOSIS — S86112A Strain of other muscle(s) and tendon(s) of posterior muscle group at lower leg level, left leg, initial encounter: Secondary | ICD-10-CM
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
## 2020-05-09 ENCOUNTER — Encounter: Payer: Self-pay | Admitting: Internal Medicine

## 2020-05-12 ENCOUNTER — Ambulatory Visit: Payer: BC Managed Care – PPO | Admitting: Orthopedic Surgery
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
## 2020-05-12 NOTE — Telephone Encounter (Addendum)
-----   Message from Camelia Phenes, [place] sent at 05/08/2020 11:16 AM EDT ----- Boris Lown,  I received the results for Ian Woodard's left foot MRI.  I attempted to call but there was no answer.  Could you please try and contact him today and let him know that he has a completely torn and retracted posterior tibialis tendon and that I have referred him to Ortho as urgent referral.  He no longer needs to see vascular.

## 2020-05-21 ENCOUNTER — Other Ambulatory Visit: Payer: BC Managed Care – PPO

## 2020-05-21 MED FILL — PREGABALIN 75 MG CAPS: 75 | 30 days supply | Qty: 60 | Fill #1

## 2020-05-21 MED FILL — METFORMIN HCL 1000 MG TABS: 1000 | 30 days supply | Qty: 60 | Fill #1

## 2020-05-21 MED FILL — ROSUVASTATIN CALCIUM 10 MG: 10 | 30 days supply | Qty: 30 | Fill #1

## 2020-07-07 ENCOUNTER — Ambulatory Visit: Payer: BC Managed Care – PPO | Admitting: Internal Medicine

## 2020-07-07 NOTE — Progress Notes (Deleted)
   Subjective:     Patient ID: Ian Woodard, male    DOB: 03-02-1971, 49 y.o.   MRN: [id number]  No chief complaint on file.   HPI: The patient is a 49 y.o. male here for follow-up on bilateral ankle swelling; worse on the left.  Patient was seen by his doctor on 9/21 for bilateral ankle swelling and an MRI of his left foot was ordered. MRI showed a completely torn and retracted posterior tibialis tendon. Referral was placed to orthopedic surgery.     Review of Systems   Objective:   Vital Signs There were no vitals taken for this visit. Vital Signs and Nursing Note Reviewed  Physical Exam    Assessment/Plan:     ICD-10-CM   1. Rupture of left posterior tibialis tendon, initial encounter  S86.112A   2. Bilateral swelling of feet and ankles  M25.471    M25.474    M25.475    M25.472     ***   Eldridge Abrahams, PA-C 07/07/2020, 7:22 AM

## 2020-07-09 ENCOUNTER — Ambulatory Visit: Payer: BC Managed Care – PPO | Admitting: Plastic Surgery

## 2020-07-09 DIAGNOSIS — M25475 Effusion, left foot: Secondary | ICD-10-CM

## 2020-07-09 DIAGNOSIS — S86112D Strain of other muscle(s) and tendon(s) of posterior muscle group at lower leg level, left leg, subsequent encounter: Secondary | ICD-10-CM

## 2020-10-06 ENCOUNTER — Other Ambulatory Visit: Payer: Self-pay | Admitting: Critical Care Medicine

## 2020-10-06 DIAGNOSIS — E1142 Type 2 diabetes mellitus with diabetic polyneuropathy: Secondary | ICD-10-CM

## 2020-10-06 NOTE — Telephone Encounter (Signed)
Requested medication (s) are due for refill today: no  Requested medication (s) are on the active medication list: yes  Future visit scheduled: no  Notes to clinic:   overdue for follow up appt Message sent to patient to contact office for appt    Requested Prescriptions  Pending Prescriptions Disp Refills   metFORMIN (GLUCOPHAGE) 1000 MG tablet [Pharmacy Med Name: metFORMIN HCL 1,000 MG TABLET] 180 tablet 1    Sig: TAKE 1 TABLET BY MOUTH TWO TIMES A DAY WITH A MEAL      There is no refill protocol information for this order      pregabalin (LYRICA) 75 MG capsule [Pharmacy Med Name: PREGABALIN 75 MG CAPSULE] 180 capsule     Sig: TAKE 1 CAPSULE BY MOUTH TWO TIMES A DAY      There is no refill protocol information for this order

## 2022-06-06 IMAGING — DX DG ANKLE COMPLETE 3+V*L*
3 series · 3 of 3 positions shown · non-contrast
Comparison: None.

CLINICAL DATA: Left ankle swelling, pain with weight-bearing

EXAM:
LEFT ANKLE COMPLETE - 3+ VIEW

[ankle ap]
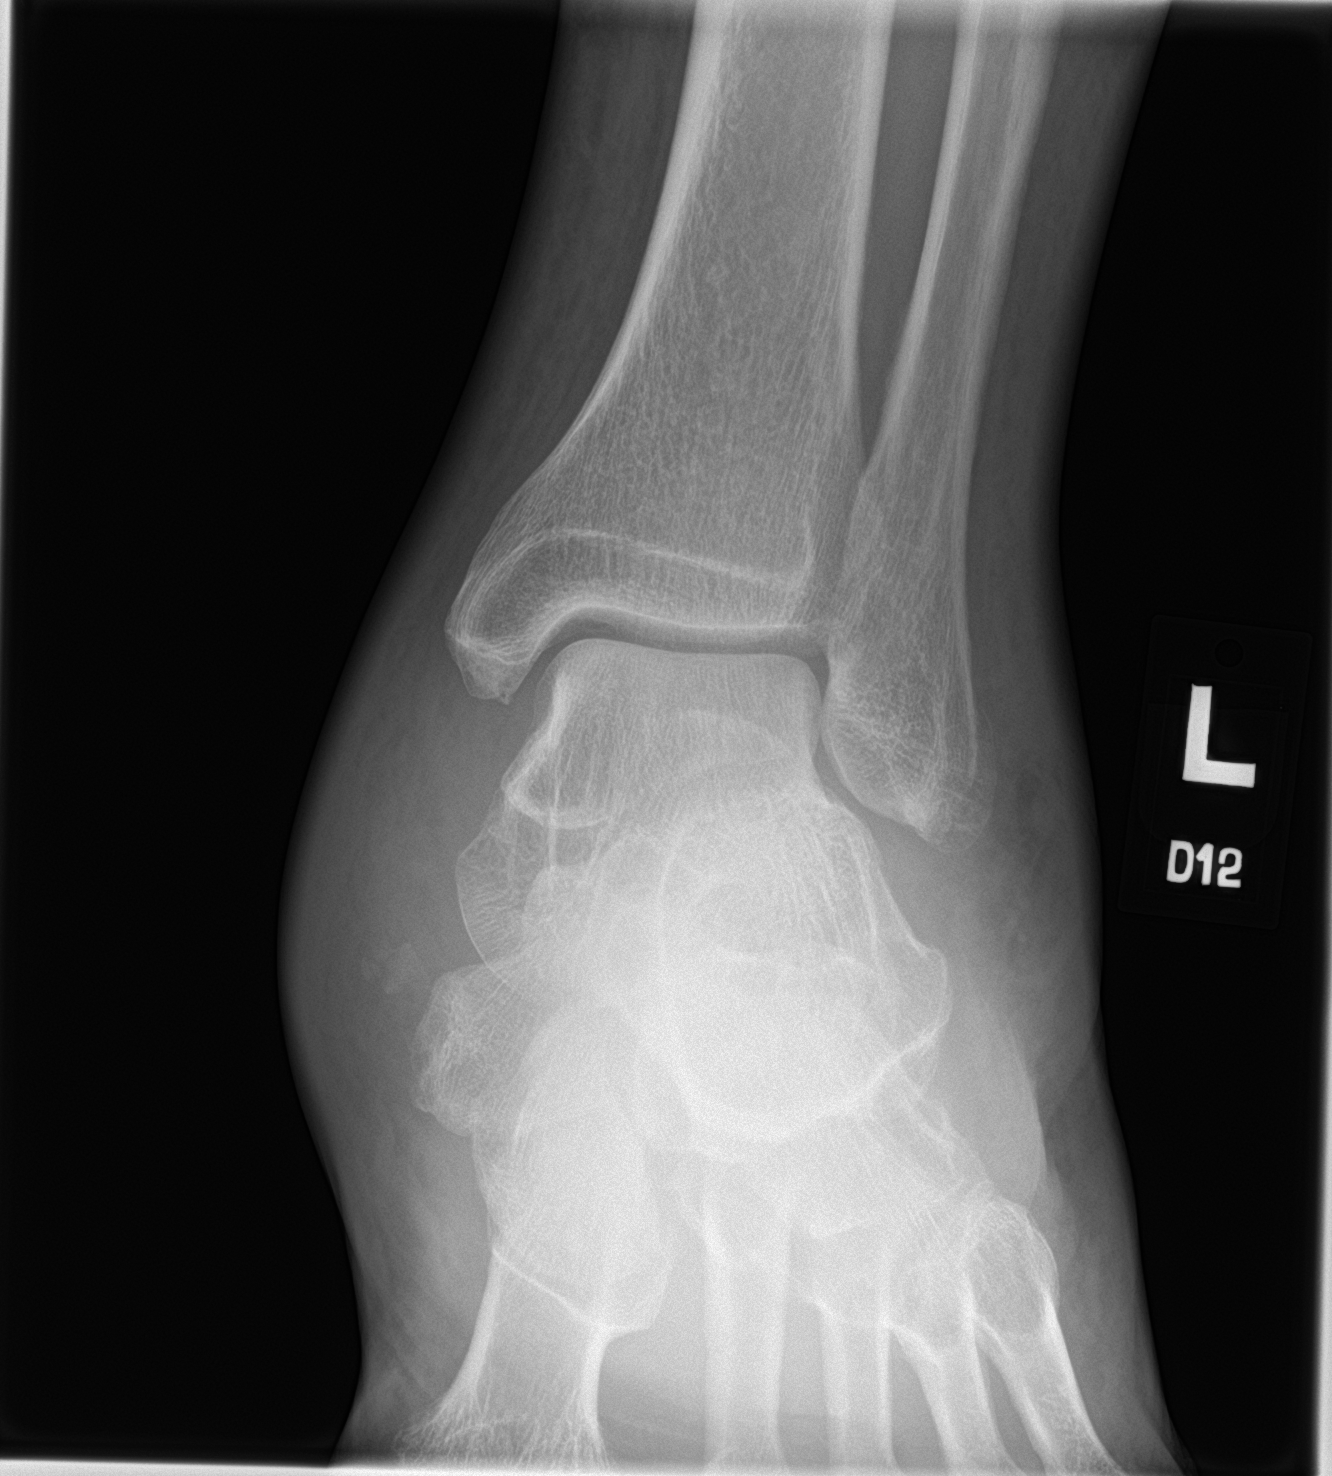

[ankle obl]
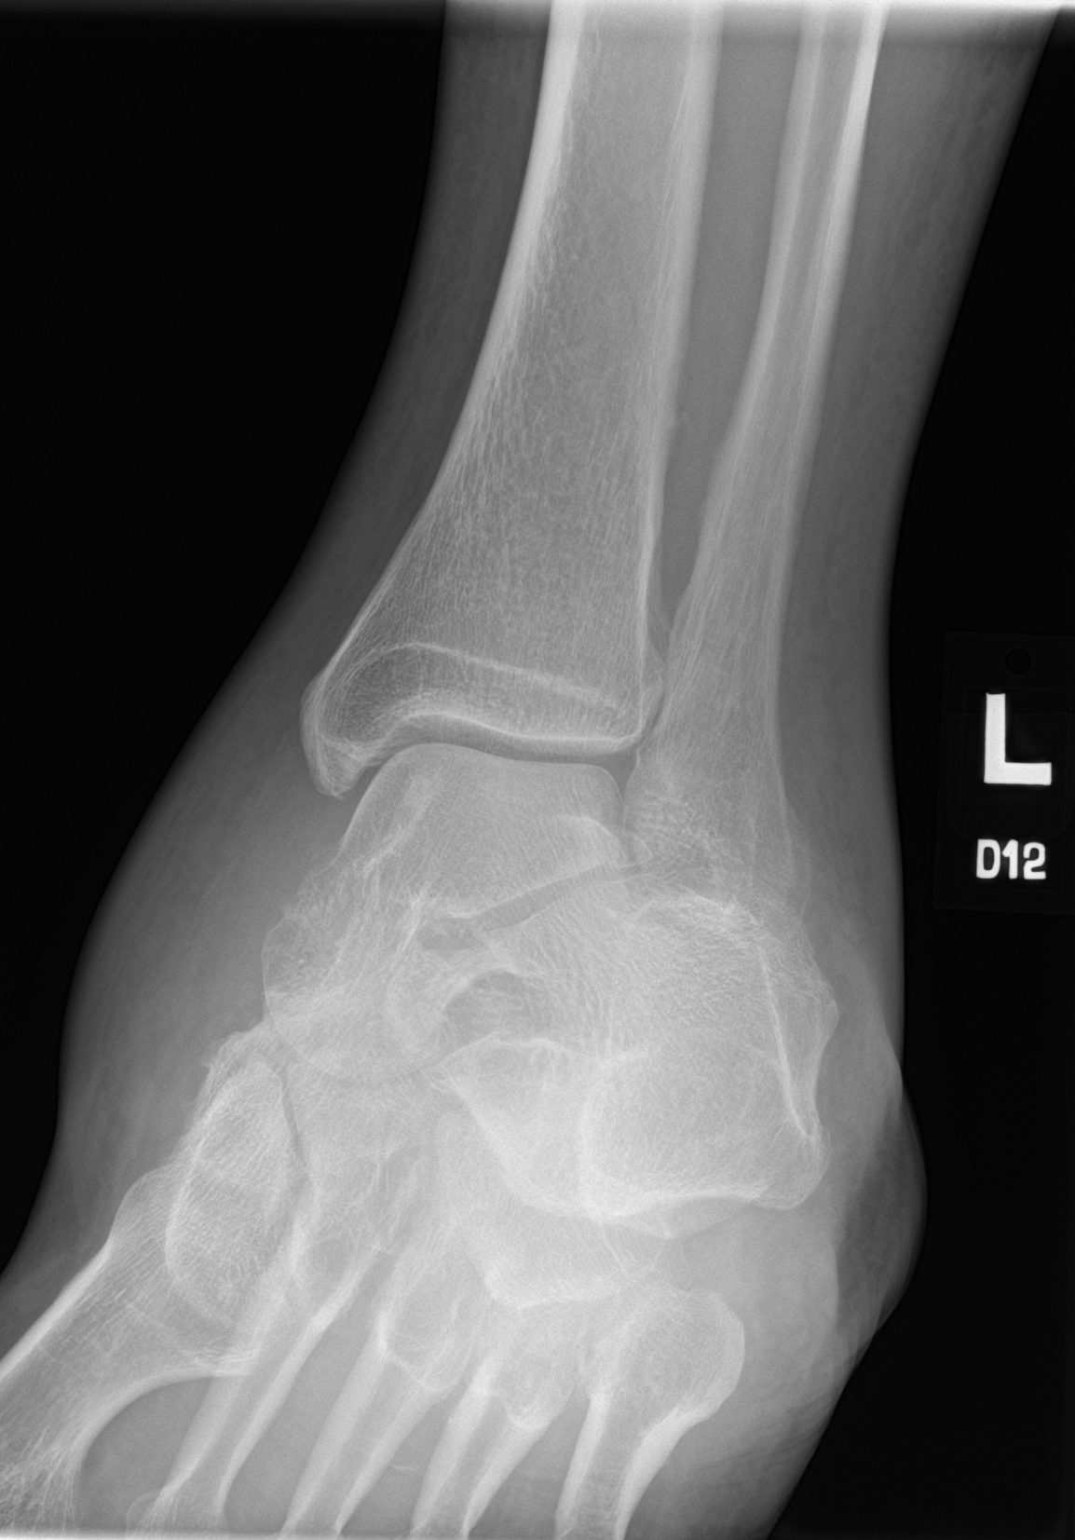

[ankle lat]
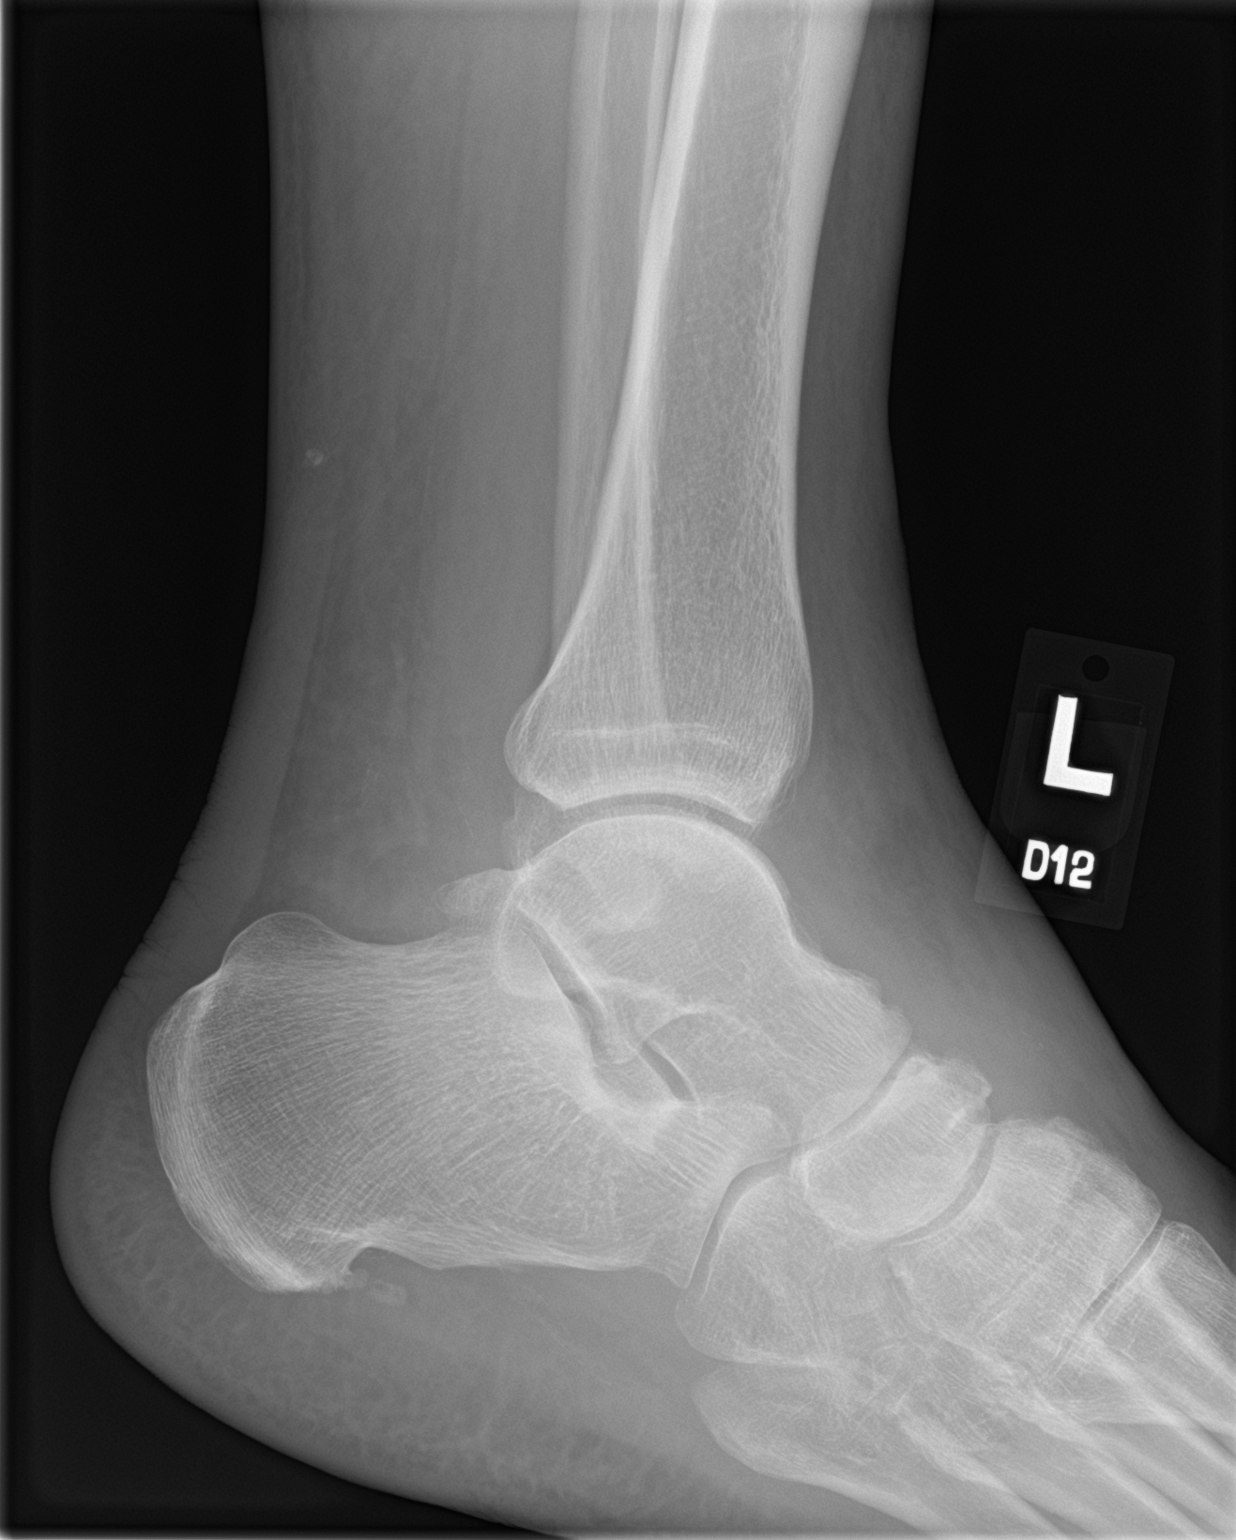

[3 of 3 positions shown; findings below may reference images not displayed]

FINDINGS: Frontal, oblique, lateral views of the left ankle are obtained.
There is no acute fracture, subluxation, or dislocation. Ankle
mortise is intact. There is mild joint space narrowing and
osteophyte formation seen within the midfoot. There is diffuse soft
tissue edema, most pronounced along the anteromedial aspect of the
ankle.
IMPRESSION: 1. Diffuse soft tissue edema.
2. Mild midfoot osteoarthritis.
3. No acute bony abnormality.
